# Patient Record
Sex: Male | Born: 1965
Health system: Southern US, Community
[De-identification: ages and names within clinical notes are randomized; demographics above are authoritative.]

## PROBLEM LIST (undated history)

## (undated) DIAGNOSIS — E785 Hyperlipidemia, unspecified: Secondary | ICD-10-CM

## (undated) DIAGNOSIS — E119 Type 2 diabetes mellitus without complications: Secondary | ICD-10-CM

## (undated) DIAGNOSIS — K219 Gastro-esophageal reflux disease without esophagitis: Secondary | ICD-10-CM

## (undated) HISTORY — PX: OTHER SURGICAL HISTORY: SHX169

## (undated) HISTORY — PX: TONSILLECTOMY: SUR1361

## (undated) HISTORY — PX: VASECTOMY: SHX75

## (undated) HISTORY — PX: NASAL SEPTUM SURGERY: SHX37

---

## 2010-05-06 ENCOUNTER — Encounter: Admission: RE | Admit: 2010-05-06 | Discharge: 2010-05-06 | Payer: Self-pay | Admitting: Internal Medicine

## 2011-04-23 ENCOUNTER — Inpatient Hospital Stay (HOSPITAL_COMMUNITY)
Admission: EM | Admit: 2011-04-23 | Discharge: 2011-04-27 | DRG: 270 | Disposition: A | Discharge status: Home / Self Care | Payer: BC Managed Care – PPO | Attending: Internal Medicine | Admitting: Internal Medicine

## 2011-04-23 DIAGNOSIS — A4901 Methicillin susceptible Staphylococcus aureus infection, unspecified site: Secondary | ICD-10-CM | POA: Diagnosis present

## 2011-04-23 DIAGNOSIS — E119 Type 2 diabetes mellitus without complications: Secondary | ICD-10-CM | POA: Diagnosis present

## 2011-04-23 DIAGNOSIS — E785 Hyperlipidemia, unspecified: Secondary | ICD-10-CM | POA: Diagnosis present

## 2011-04-23 DIAGNOSIS — IMO0002 Reserved for concepts with insufficient information to code with codable children: Principal | ICD-10-CM | POA: Diagnosis present

## 2011-04-24 ENCOUNTER — Emergency Department (HOSPITAL_COMMUNITY): Payer: BC Managed Care – PPO

## 2011-04-24 DIAGNOSIS — IMO0002 Reserved for concepts with insufficient information to code with codable children: Secondary | ICD-10-CM

## 2011-04-24 LAB — DIFFERENTIAL
Eosinophils Absolute: 0 10*3/uL (ref 0.0–0.7)
Lymphocytes Relative: 14 % (ref 12–46)
Lymphs Abs: 1.6 10*3/uL (ref 0.7–4.0)
Monocytes Absolute: 0.9 10*3/uL (ref 0.1–1.0)
Monocytes Relative: 8 % (ref 3–12)
Neutro Abs: 9.5 10*3/uL — ABNORMAL HIGH (ref 1.7–7.7)
Neutrophils Relative %: 79 % — ABNORMAL HIGH (ref 43–77)

## 2011-04-24 LAB — CBC
Hemoglobin: 14.8 g/dL (ref 13.0–17.0)
MCH: 30 pg (ref 26.0–34.0)
MCHC: 34.6 g/dL (ref 30.0–36.0)
MCV: 86.8 fL (ref 78.0–100.0)
RBC: 4.93 MIL/uL (ref 4.22–5.81)
RDW: 12.1 % (ref 11.5–15.5)

## 2011-04-24 LAB — BASIC METABOLIC PANEL
BUN: 16 mg/dL (ref 6–23)
GFR calc non Af Amer: >60 mL/min (ref >60)
Sodium: 138 mEq/L (ref 135–145)

## 2011-04-24 LAB — GLUCOSE, CAPILLARY: Glucose-Capillary: 165 mg/dL — ABNORMAL HIGH (ref 70–99)

## 2011-04-25 LAB — DIFFERENTIAL
Basophils Absolute: 0 10*3/uL (ref 0.0–0.1)
Basophils Relative: 0 % (ref 0–1)
Eosinophils Relative: 1 % (ref 0–5)
Lymphocytes Relative: 25 % (ref 12–46)
Monocytes Absolute: 1.1 10*3/uL — ABNORMAL HIGH (ref 0.1–1.0)
Monocytes Relative: 13 % — ABNORMAL HIGH (ref 3–12)
Neutrophils Relative %: 62 % (ref 43–77)

## 2011-04-25 LAB — COMPREHENSIVE METABOLIC PANEL
ALT: 45 U/L (ref 0–53)
AST: 49 U/L — ABNORMAL HIGH (ref 0–37)
Albumin: 3 g/dL — ABNORMAL LOW (ref 3.5–5.2)
BUN: 12 mg/dL (ref 6–23)
Creatinine, Ser: 1.22 mg/dL (ref 0.50–1.35)
Glucose, Bld: 130 mg/dL — ABNORMAL HIGH (ref 70–99)
Total Protein: 6.3 g/dL (ref 6.0–8.3)

## 2011-04-25 LAB — GLUCOSE, CAPILLARY: Glucose-Capillary: 176 mg/dL — ABNORMAL HIGH (ref 70–99)

## 2011-04-25 LAB — CBC
MCH: 30.6 pg (ref 26.0–34.0)
MCV: 87 fL (ref 78.0–100.0)

## 2011-04-25 LAB — VANCOMYCIN, TROUGH: Vancomycin Tr: 9.4 ug/mL — ABNORMAL LOW (ref 10.0–20.0)

## 2011-04-25 LAB — HEMOGLOBIN A1C: Hgb A1c MFr Bld: 7.9 % — ABNORMAL HIGH (ref <5.7)

## 2011-04-26 LAB — BASIC METABOLIC PANEL
BUN: 10 mg/dL (ref 6–23)
Chloride: 102 mEq/L (ref 96–112)
Glucose, Bld: 163 mg/dL — ABNORMAL HIGH (ref 70–99)
Potassium: 3.8 mEq/L (ref 3.5–5.1)
Sodium: 135 mEq/L (ref 135–145)

## 2011-04-26 LAB — CBC
HCT: 36.1 % — ABNORMAL LOW (ref 39.0–52.0)
RDW: 11.9 % (ref 11.5–15.5)
WBC: 7.9 10*3/uL (ref 4.0–10.5)

## 2011-04-26 LAB — GLUCOSE, CAPILLARY
Glucose-Capillary: 158 mg/dL — ABNORMAL HIGH (ref 70–99)
Glucose-Capillary: 201 mg/dL — ABNORMAL HIGH (ref 70–99)

## 2011-04-27 LAB — WOUND CULTURE: Gram Stain: NONE SEEN

## 2011-04-27 LAB — BASIC METABOLIC PANEL
CO2: 26 mEq/L (ref 19–32)
Calcium: 8.6 mg/dL (ref 8.4–10.5)
Creatinine, Ser: 0.96 mg/dL (ref 0.50–1.35)
GFR calc Af Amer: >60 mL/min (ref >60)
GFR calc non Af Amer: >60 mL/min (ref >60)
Sodium: 138 mEq/L (ref 135–145)

## 2011-04-27 LAB — GLUCOSE, CAPILLARY
Glucose-Capillary: 202 mg/dL — ABNORMAL HIGH (ref 70–99)
Glucose-Capillary: 191 mg/dL — ABNORMAL HIGH (ref 70–99)

## 2011-05-01 LAB — CULTURE, BLOOD (ROUTINE X 2)
Culture: NO GROWTH
Culture: NO GROWTH

## 2011-05-02 NOTE — Consult Note (Signed)
Jon Torres, Jon Torres NO.:  [id number]  MEDICAL RECORD NO.:  [id number]  LOCATION:  1315                         FACILITY:  [facility]  PHYSICIAN:  Sandria Bales. Ezzard Standing, M.D.  DATE OF BIRTH:  1966/10/07  DATE OF CONSULTATION: 24 April 2011                                CONSULTATION  REASON FOR CONSULTATION:  Cellulitis, left elbow.  REFERRING PHYSICIAN:  [name], M.D.  HISTORY OF PRESENT ILLNESS:  This is a 45 year old white male who sees Dr. [name] as his primary medical doctor of [facility].  He has  noticed over 4 to 5 days increasing tenderness right at or below his left elbow.  He started running a fever at home, had increasing pain and came to the [facility] Emergency Room.  He is admitted by Dr. [name] for cellulitis at his left elbow and placed on IV antibiotics.  I was called for consultation for management of this infection.  The patient himself has never had any prior infection but he had a son about 3 to 4 years ago who had a MRSA infection.  PAST MEDICAL HISTORY:  He has no allergies.  MEDICINES:  He is on medicine for diabetes but he is not on insulin.  He takes medicine for cholesterol and something for his kidneys.  The names of his medicines I do not have.  REVIEW OF SYSTEMS:   NEUROLOGIC:  No seizure or loss of conscious. PULMONARY:  He does not smoke cigarettes, no pneumonia.  He does not have sleep apnea.   CARDIAC:  He has no heart disease, chest pain or hypertension.  He has not had a  heart catheterization. GASTROINTESTINAL:  No history of peptic ulcer disease or liver disease. UROLOGIC:  No history of kidney stones or kidney infection. MUSCULOSKELETAL:  He had some right shoulder dislocation in the past treated by an orthopedic doctor whose name he cannot remember.  SOCIAL HISTORY:  He is married.  Works in Consulting civil engineer.  PHYSICAL EXAMINATION:  VITAL SIGNS: His temperature is 99.6, pulse is 93, blood pressure  94/63. GENERAL:  He is a well-nourished heavy white male. NECK:  His neck is supple.  I feel no masses or thyromegaly. LUNGS:  Clear to auscultation. HEART:  His heart has a regular rate and rhythm without murmur or rub. EXTREMITIES:  His left elbow, he has a tender area on his left forearm, maybe 3 or 4 cm below the elbow, ulnar side of the forearm.  He can extend his arm almost fully and he can flex it up at least to about 110 degrees.  He really does not have much pain on flexion other than pain locally at the soft tissue area.  He has a raised area which is about 3 or 4 cm consistent with a local cellulitis.  He has palpable pulse in the left arm.  LABORATORY DATA:  His labs show white blood count of 12,000, hemoglobin 14, hematocrit 42.  X-rays of his left elbow show no air or foreign body.  IMPRESSION: 1. My impression is that he has cellulitis of the soft tissues of left     forearm without obvious joint involvement.  [Note: Apparently,  the ER had talked to Dr. Yevette Torres about the patient.  I spoke with Dr. Yevette Torres but it was unclear of how his followup would be.  Anyway, the patient now hospitalized on IV antibiotics and I will do an incision and drainage at the bedside.  1. Diabetes mellitus. 2. History of elevated cholesterol. 3. At least a family member who had an methicillin-resistant     staphylococcus aureus within the last 3 or 4 years. 4. He thinks his tetanus is up-to-date that he has had a tetanus shot     within the last 10 years.   Sandria Bales. Ezzard Standing, M.D., FACS   DHN/MEDQ  D:  04/24/2011  T:  04/24/2011  Job:  [id number]  cc:   Dr. Charlestine Night, M.D. Fax: [phone]  Electronically Signed by Ovidio Kin M.D. on 05/02/2011 11:20:31 AM

## 2011-05-02 NOTE — Op Note (Signed)
  NAMEKATHRYN, LINAREZ NO.:  [id number]  MEDICAL RECORD NO.:  [id number]  LOCATION:  1315                         FACILITY:  [facility]  PHYSICIAN:  Sandria Bales. Ezzard Standing, M.D.  DATE OF BIRTH:  [birth year]  DATE OF PROCEDURE:  04/24/2011                               OPERATIVE REPORT   PREOPERATIVE DIAGNOSIS:  Cellulitis/abscess, left elbow, forearm soft tissue.  POSTOPERATIVE DIAGNOSIS:  Cellulitis, left forearm, elbow soft tissue.  PROCEDURE:  Incision and drainage of abscess.  SURGEON:  Sandria Bales. Ezzard Standing, M.D.  ANESTHESIA:  About 10 cc of 2% Xylocaine.  COMPLICATIONS:  None.  INDICATIONS FOR PROCEDURE:  Mr. Folkert is a 45 year old white male patient of Dr. Juline Patch who came with a tenderness and inflammation of his left forearm right below the elbow on the ulnar side.  He is able to move his elbow, so I do not think he has obvious joint inflammation.  I think he would be best served by having an I and D of this area of the soft tissue.  PROCEDURE:  The patient is in a supine position in his room.  I painted the area with Betadine solution, infiltrated with about 10 cc of 2% Xylocaine.  I made an incision about 2 cm long cutting out about 1 cm disk of skin and got into the soft tissue.  I really did not get much  pus out, maybe a little bit.  I did obtain cultures.  I think this is more of a soft tissue inflammation than an abscess.  We will do dressing changes three times a day.  He is on IV antibiotics. We will elevate the arm and re-evaluate in 24 hours.   Sandria Bales. Ezzard Standing, M.D., FACS   DHN/MEDQ  D:  04/24/2011  T:  04/24/2011  Job:  [id number]  cc:   Juline Patch, M.D. Fax: [phone]  Electronically Signed by Ovidio Kin M.D. on 05/02/2011 11:22:58 AM

## 2011-05-05 NOTE — Discharge Summary (Signed)
  [name], [name]:  [id number]  MEDICAL RECORD NO.:  [id number]  LOCATION:  1315                         FACILITY:  [facility]  PHYSICIAN:  Conley Canal, MD      DATE OF BIRTH:  1965-12-25  DATE OF ADMISSION:  04/23/2011 DATE OF DISCHARGE:  04/27/2011                        DISCHARGE SUMMARY - REFERRING   PRIMARY CARE PHYSICIAN:  Juline Patch, M.D.  CONSULTING PHYSICIAN:  Dr. Ovidio Kin.  DISCHARGE DIAGNOSES: 1. MSSA cellulitis of the left elbow 2. Diabetes mellitus type 2. 3. Hyperlipidemia. 4. Morbid obesity.  DISCHARGE MEDICATIONS: 1. Doxycycline 100 mg twice daily for 6 more days. 2. Moxifloxacin 400 mg daily for 8 more days. 3. Ambien 5 mg q.h.s. p.r.n. 4. Glipizide XL 10 mg daily. 5. Hydrocortisone topical ointment twice daily as needed. 6. Motrin 400 mg every 8 hours as needed. 7. Lipitor 40 mg daily. 8. Lisinopril 10 mg daily. 9. Metformin 500 mg twice daily.  PROCEDURES PERFORMED:  X-ray of the left elbow on April 24, 2011, showed no acute bony abnormalities.  HOSPITAL COURSE:  [name] was admitted on April 23, 2011, with complaints of worsening redness and swelling of the left elbow preceded by a bump in the same area.  At the time of admission, he was found to have cellulitis of the same extremity and his white count was 12,000. He was seen by Dr. Ezzard Standing with [facility] who felt that this was essentially cellulitis and recommended broad-spectrum antibiotics.  The patient has continued to do well and wound cultures sent on April 24, 2011, grew Staphylococcus aureus sensitive to clindamycin, erythromycin, gentamicin, Levaquin, oxacillin, rifampin, Bactrim, vancomycin, tetracycline moxifloxacin.  His white count prior to discharge was 7.9, hemoglobin 12.8, hematocrit 36.1, platelet count 166.  Electrolytes were unremarkable.  Blood cultures were negative x2. Hemoglobin A1c was 7.9.  He is hemodynamically stable and will  be discharged to complete 2 weeks of antibiotics with doxycycline and moxifloxacin.  He will follow with Dr. [name] in the next 1 week.  He may need referral to General Surgery depending on how he is doing.  He is discharged in stable condition.  Time spent for discharge preparation, less than 30 minutes.     Conley Canal, MD     SR/MEDQ  D:  04/27/2011  T:  04/27/2011  Job:  [id number]  cc:   Juline Patch, M.D. Fax: [phone]  Sandria Bales. Ezzard Standing, M.D. [address] [place] [address]  Electronically Signed by Conley Canal  on 05/05/2011 07:29:47 PM

## 2011-05-16 NOTE — H&P (Signed)
Jon Torres, Jon Torres NO.:  [id number]  MEDICAL RECORD NO.:  [id number]  LOCATION:  1315                         FACILITY:  [facility]  PHYSICIAN:  [name], M.D. DATE OF BIRTH:  [birth year]  DATE OF ADMISSION:  04/23/2011 DATE OF DISCHARGE:                             HISTORY & PHYSICAL   DATE OF ADMISSION:  April 24, 2011.  CHIEF COMPLAINT:  Worsening redness and swelling, left elbow.  HISTORY OF PRESENT ILLNESS:  This is a 45 year old male with a history of type 2 diabetes mellitus, who presents to the Emergency Department with complaints of worsening redness and swelling at the left elbow area over the past 5 days.  Patient reports the area started as a small pimple like area, which continued to increase in size and had associated redness and pain.  Patient denies any knowledge of an insect bite.  He does report aggravating the area by trying to squeeze the area.  Patient began to have fever today to a temperature of 101.  In the Emergency Department, patient was evaluated and the EDP did discuss the findings with Orthopedics on-call, Dr. Derald Macleod and recommendations were made for IV antibiotic treatment with vancomycin and Zosyn.  Patient was referred for medical admission.  PAST MEDICAL HISTORY: 1. Type 2 diabetes mellitus. 2. Morbid obesity.  PAST SURGICAL HISTORY:  History of a varicocele repair and nasal septal deviation repair.  MEDICATIONS:  Include: 1. Actos.  Patient reports not taking Actos anymore. 2. Ambien. 3. Glipizide. 4. Lipitor. 5. Lisinopril. 6. Metformin.  ALLERGIES:  No known drug allergies.  SOCIAL HISTORY:  Patient is married with two children.  He is a nonsmoker, nondrinker.  No history of illicit drug usage.  FAMILY HISTORY:  Positive for diabetic disease in his father.  Positive for hypertension in his mother.  Positive for cancer in his paternal grandmother, unknown type.  No coronary artery disease in his  family that he knows of.  REVIEW OF SYSTEMS:  Pertinent as mentioned above.  PHYSICAL EXAMINATION FINDINGS:  GENERAL:  This is a morbidly obese 32- year-old Caucasian male, who is in mild discomfort, but no acute distress. VITAL SIGNS:  Currently, temperature 100.9, blood pressure 122/86, heart rate 114, respirations 20, O2 saturations 98%. HEENT EXAMINATION:  Normocephalic, atraumatic.  Pupils are equally round and reactive to light.  Extraocular movements are intact.  Funduscopic benign.  There is no scleral icterus.  Nares are patent bilaterally. Oropharynx is clear. NECK:  Supple.  Full range of motion.  No thyromegaly, adenopathy, jugular venous distention. CARDIOVASCULAR:  Regular rate and rhythm with mild tachycardia.  No murmurs, gallops or rubs appreciated. CHEST:  Chest wall:  Normal excursion.  No chest wall tenderness and breathing is unlabored. ABDOMEN:  Positive bowel sounds.  Soft, nontender, nondistended.  No hepatosplenomegaly. EXTREMITIES:  Without cyanosis, clubbing or edema. SKIN EXAMINATION:  Patient has inflammation at the point of the olecranon bursa with left elbow.  There is an area of induration and inflammation with a circumference of approximately 1 cm.  There is a central punctum or scar area.  There is no exudation from the area.  The area is tender and fluctuant. NEUROLOGIC EXAMINATION:  Nonfocal.  LABORATORY STUDIES:  White blood cell count 12.0, hemoglobin 14.8, hematocrit 42.8, platelets 173,000, neutrophils 79%, lymphocytes 14%. Sodium 138, potassium 4.2, chloride 101, CO2 of 28, BUN 16, creatinine 1.10 and glucose 140.  DIAGNOSTIC STUDIES:  X-rays of the left elbow are negative for fracture and negative for any gas collections, which would imply osteomyelitis.  ASSESSMENT:  [age]-year-old male being admitted with: 1. Cellulitis of the left elbow. 2. Type 2 diabetes mellitus. 3. Morbid obesity.  PLAN:  Patient will be admitted to a  med-surg area.  IV antibiotic therapy of vancomycin and Zosyn will be continued.  IV fluids have also been ordered and patient will be placed on sliding scale insulin coverage.  His medications will be further verified.  DVT prophylaxis has been ordered.  General surgery has been consulted to see this patient to evaluate for possible I and D if it is possible.  Code status is a full code.     [name], M.D.     HJ/MEDQ  D:  04/24/2011  T:  04/24/2011  Job:  [id number]  Electronically Signed by [name] M.D. on 05/16/2011 11:54:19 AM

## 2011-11-18 IMAGING — CR DG ELBOW COMPLETE 3+V*L*
4 series · 4 of 4 positions shown · non-contrast
Comparison: None.

CLINICAL DATA: Elbow pain with possible insect bite.  Fever.

LEFT ELBOW - COMPLETE 3+ VIEW

[x elbow joint ap left]
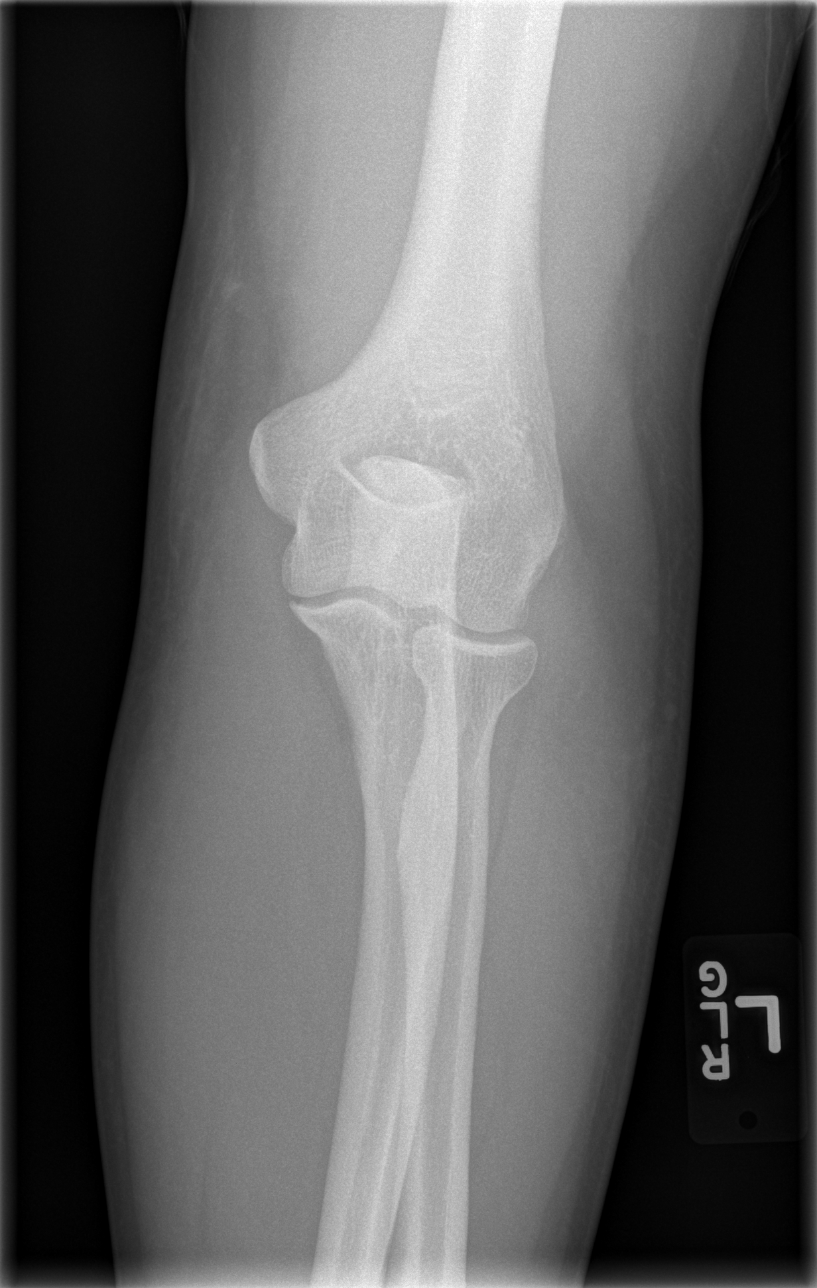

[x elbow joint obl. left *]
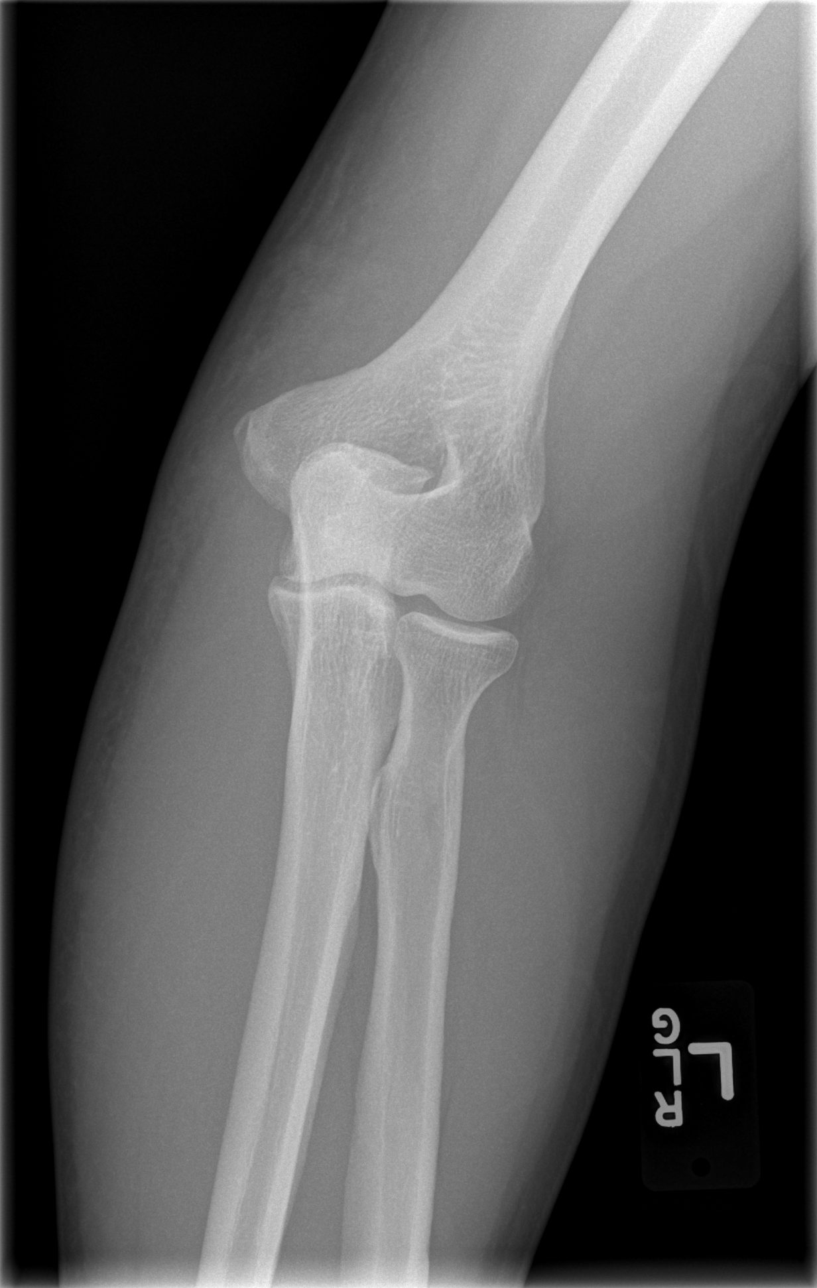

[x elbow joint obl. left]
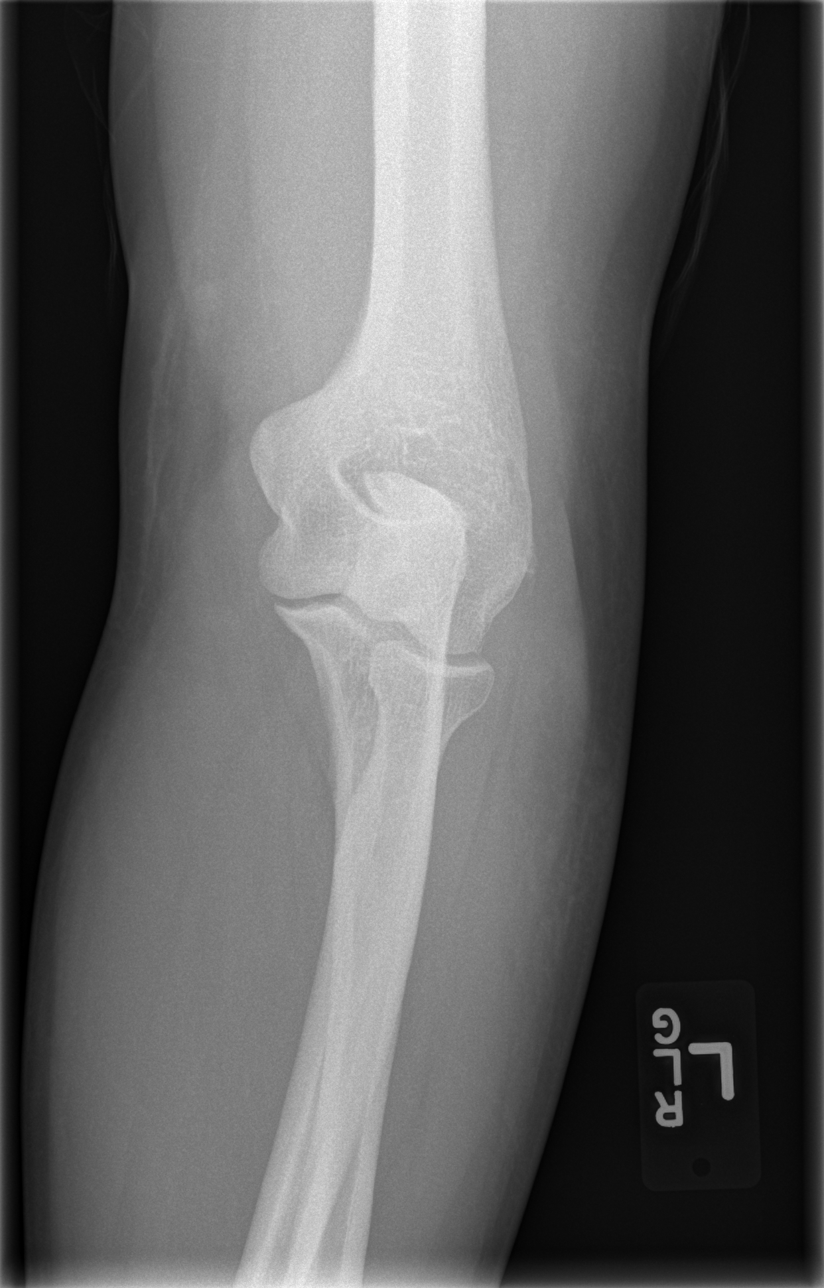

[x elbow joint lat left]
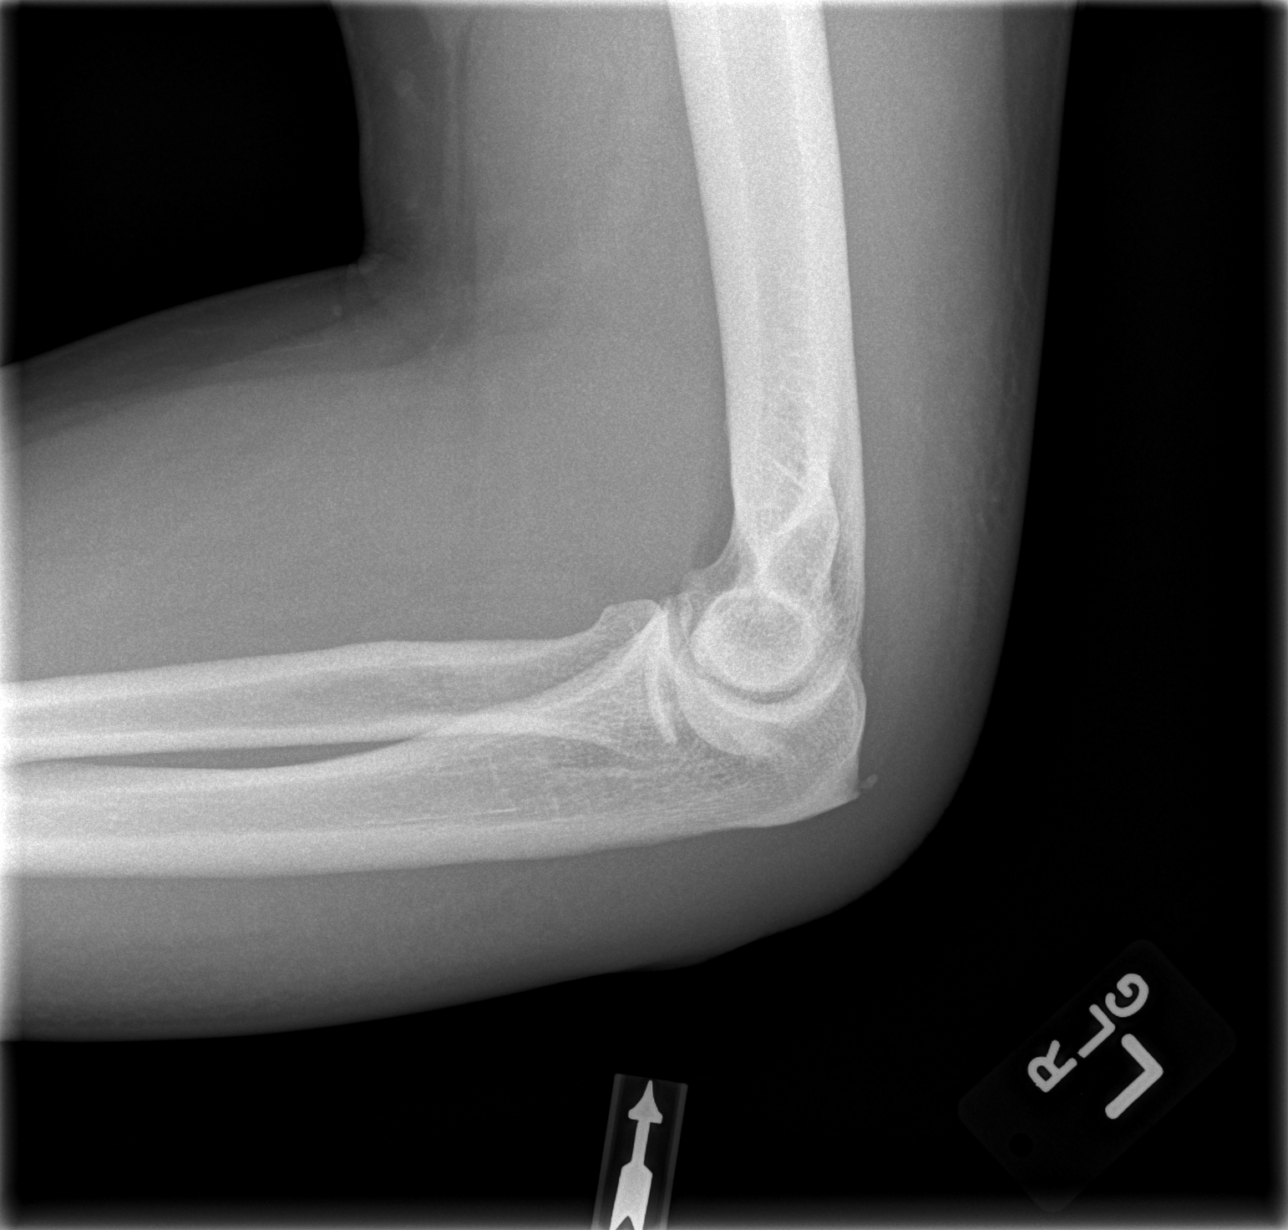

[4 of 4 positions shown; findings below may reference images not displayed]

FINDINGS: Small olecranon spur. No evidence of acute fracture or
subluxation.  No focal bone lesions.  Bone matrix and cortex appear
intact.  No abnormal radiopaque densities in the soft tissues. No
abnormal gas collections in the visualized soft tissues.  No
significant effusion.
IMPRESSION: No acute bony abnormalities demonstrated no radiopaque foreign
bodies or gas collections demonstrated in the soft tissues.

## 2015-12-16 MED FILL — ZOLPIDEM TARTRATE 10 MG TAB: 10 | 30 days supply | Qty: 30 | Fill #0

## 2016-01-27 MED FILL — ZOLPIDEM TARTRATE 10 MG TAB: 10 | 30 days supply | Qty: 30 | Fill #1

## 2016-02-26 MED FILL — ZOLPIDEM TARTRATE 10 MG TAB: 10 | 30 days supply | Qty: 30 | Fill #2

## 2016-04-06 MED FILL — ZOLPIDEM TARTRATE 10 MG TAB: 10 | 30 days supply | Qty: 30 | Fill #3

## 2016-05-09 MED FILL — ZOLPIDEM TARTRATE 10 MG TAB: 10 | 30 days supply | Qty: 30 | Fill #4

## 2016-06-29 MED FILL — ZOLPIDEM TARTRATE 10 MG TAB: 10 | 30 days supply | Qty: 30 | Fill #0

## 2016-07-29 MED FILL — ZOLPIDEM TARTRATE 10 MG TAB: 10 | 30 days supply | Qty: 30 | Fill #1

## 2016-09-08 MED FILL — ZOLPIDEM TARTRATE 10 MG TAB: 10 | 30 days supply | Qty: 30 | Fill #2

## 2016-10-07 MED FILL — ZOLPIDEM TARTRATE 10 MG TAB: 10 | 30 days supply | Qty: 30 | Fill #3

## 2016-11-09 MED FILL — ZOLPIDEM TARTRATE 10 MG TAB: 10 | 30 days supply | Qty: 30 | Fill #4

## 2016-12-08 MED FILL — ZOLPIDEM TARTRATE 10 MG TAB: 10 | 30 days supply | Qty: 30 | Fill #5

## 2017-01-10 MED FILL — ZOLPIDEM TARTRATE 10 MG TAB: 10 | 30 days supply | Qty: 30 | Fill #0

## 2017-02-08 MED FILL — ZOLPIDEM TARTRATE 10 MG TAB: 10 | 30 days supply | Qty: 30 | Fill #1

## 2017-03-08 MED FILL — ZOLPIDEM TARTRATE 10 MG TAB: 10 | 30 days supply | Qty: 30 | Fill #2

## 2017-04-10 MED FILL — ZOLPIDEM TARTRATE 10 MG TAB: 10 | 30 days supply | Qty: 30 | Fill #3

## 2017-05-09 MED FILL — ZOLPIDEM TARTRATE 10 MG TAB: 10 | 30 days supply | Qty: 30 | Fill #4

## 2017-06-12 MED FILL — ZOLPIDEM TARTRATE 10 MG TAB: 10 | 30 days supply | Qty: 30 | Fill #5

## 2017-07-12 MED FILL — ZOLPIDEM TARTRATE 10 MG TAB: 10 | 30 days supply | Qty: 30 | Fill #0

## 2017-08-29 MED FILL — ZOLPIDEM TARTRATE 10 MG TAB: 10 | 30 days supply | Qty: 30 | Fill #0

## 2017-10-03 MED FILL — ZOLPIDEM TARTRATE 10 MG TAB: 10 | 30 days supply | Qty: 30 | Fill #1

## 2017-11-20 MED FILL — ZOLPIDEM TARTRATE 10 MG TAB: 10 | 30 days supply | Qty: 30 | Fill #2

## 2017-12-28 MED FILL — ZOLPIDEM TARTRATE 10 MG TAB: 10 | 30 days supply | Qty: 30 | Fill #3

## 2018-01-26 MED FILL — ZOLPIDEM TARTRATE 10 MG TAB: 10 | 30 days supply | Qty: 30 | Fill #4

## 2018-04-03 MED FILL — ZOLPIDEM TARTRATE 10 MG TAB: 10 | 30 days supply | Qty: 30 | Fill #0

## 2018-06-19 MED FILL — ZOLPIDEM TARTRATE 10 MG TAB: 10 | 30 days supply | Qty: 30 | Fill #1

## 2018-08-10 MED FILL — ZOLPIDEM TARTRATE 10 MG TAB: 10 | 30 days supply | Qty: 30 | Fill #2

## 2018-09-21 MED FILL — ZOLPIDEM TARTRATE 10 MG TAB: 10 | 30 days supply | Qty: 30 | Fill #3

## 2018-10-29 MED FILL — ZOLPIDEM TARTRATE 10 MG TAB: 10 | 30 days supply | Qty: 30 | Fill #0

## 2018-12-11 MED FILL — ZOLPIDEM TARTRATE 10 MG TAB: 10 | 30 days supply | Qty: 30 | Fill #1 | Status: TO

## 2019-02-01 MED FILL — ZOLPIDEM TARTRATE 10 MG TAB: 10 | 30 days supply | Qty: 30 | Fill #0

## 2019-03-13 MED FILL — ZOLPIDEM TARTRATE 10 MG TAB: 10 | 30 days supply | Qty: 30 | Fill #1

## 2019-04-29 MED FILL — ZOLPIDEM TARTRATE 10 MG TAB: 10 | 30 days supply | Qty: 30 | Fill #0

## 2019-06-14 MED FILL — ZOLPIDEM TARTRATE 10 MG TAB: 10 | 30 days supply | Qty: 30 | Fill #1

## 2019-07-17 MED FILL — ZOLPIDEM TARTRATE 10 MG TAB: 10 | 30 days supply | Qty: 30 | Fill #2

## 2019-07-19 ENCOUNTER — Ambulatory Visit: Payer: Self-pay | Admitting: Podiatry

## 2019-08-30 MED FILL — ZOLPIDEM TARTRATE 10 MG TAB: 10 | 30 days supply | Qty: 30 | Fill #3

## 2020-01-08 ENCOUNTER — Ambulatory Visit: Payer: Self-pay | Attending: Internal Medicine

## 2020-01-08 DIAGNOSIS — Z23 Encounter for immunization: Secondary | ICD-10-CM

## 2020-01-08 NOTE — Progress Notes (Signed)
   Covid-19 Vaccination Clinic  Name:  Jon Torres    MRN: [id number] DOB: 12-08-1965  01/08/2020  Jon Torres was observed post Covid-19 immunization for 15 minutes without incident. He was provided with Vaccine Information Sheet and instruction to access the V-Safe system.   Jon Torres was instructed to call 911 with any severe reactions post vaccine: Marland Kitchen Difficulty breathing  . Swelling of face and throat  . A fast heartbeat  . A bad rash all over body  . Dizziness and weakness   Immunizations Administered    Name Date Dose VIS Date Route   Pfizer COVID-19 Vaccine 01/08/2020  4:29 PM 0.3 mL 10/04/2019 Intramuscular   Manufacturer: [employer], [employer]   Lot: [id number]   NDC: [id number]

## 2020-02-03 ENCOUNTER — Ambulatory Visit: Payer: Self-pay | Attending: Internal Medicine

## 2020-02-03 DIAGNOSIS — Z23 Encounter for immunization: Secondary | ICD-10-CM

## 2020-02-03 NOTE — Progress Notes (Signed)
   Covid-19 Vaccination Clinic  Name:  Jon Torres    MRN: [id number] DOB: Apr 29, 1966  02/03/2020  [name] was observed post Covid-19 immunization for 15 minutes without incident. He was provided with Vaccine Information Sheet and instruction to access the V-Safe system.   [name] was instructed to call 911 with any severe reactions post vaccine: Marland Kitchen Difficulty breathing  . Swelling of face and throat  . A fast heartbeat  . A bad rash all over body  . Dizziness and weakness   Immunizations Administered    Name Date Dose VIS Date Route   Pfizer COVID-19 Vaccine 02/03/2020  4:22 PM 0.3 mL 10/04/2019 Intramuscular   Manufacturer: [employer], [employer]   Lot: [id number]   NDC: [address]-2

## 2020-09-09 ENCOUNTER — Other Ambulatory Visit: Payer: Self-pay | Admitting: Otolaryngology

## 2020-09-15 ENCOUNTER — Encounter (HOSPITAL_BASED_OUTPATIENT_CLINIC_OR_DEPARTMENT_OTHER): Payer: Self-pay | Admitting: Otolaryngology

## 2020-09-15 ENCOUNTER — Other Ambulatory Visit: Payer: Self-pay

## 2020-09-21 ENCOUNTER — Other Ambulatory Visit (HOSPITAL_COMMUNITY)
Admission: RE | Admit: 2020-09-21 | Discharge: 2020-09-21 | Disposition: A | Discharge status: Home / Self Care | Payer: BC Managed Care – PPO | Source: Ambulatory Visit | Attending: Otolaryngology | Admitting: Otolaryngology

## 2020-09-21 ENCOUNTER — Other Ambulatory Visit: Payer: Self-pay

## 2020-09-21 ENCOUNTER — Encounter (HOSPITAL_BASED_OUTPATIENT_CLINIC_OR_DEPARTMENT_OTHER)
Admission: RE | Admit: 2020-09-21 | Discharge: 2020-09-21 | Disposition: A | Discharge status: Home / Self Care | Payer: BC Managed Care – PPO | Source: Ambulatory Visit | Attending: Otolaryngology | Admitting: Otolaryngology

## 2020-09-21 DIAGNOSIS — Z01818 Encounter for other preprocedural examination: Secondary | ICD-10-CM | POA: Insufficient documentation

## 2020-09-21 DIAGNOSIS — J3489 Other specified disorders of nose and nasal sinuses: Secondary | ICD-10-CM | POA: Diagnosis not present

## 2020-09-21 DIAGNOSIS — E139 Other specified diabetes mellitus without complications: Secondary | ICD-10-CM | POA: Insufficient documentation

## 2020-09-21 DIAGNOSIS — Z01812 Encounter for preprocedural laboratory examination: Secondary | ICD-10-CM | POA: Insufficient documentation

## 2020-09-21 DIAGNOSIS — Z79899 Other long term (current) drug therapy: Secondary | ICD-10-CM | POA: Diagnosis not present

## 2020-09-21 DIAGNOSIS — Z20822 Contact with and (suspected) exposure to covid-19: Secondary | ICD-10-CM | POA: Insufficient documentation

## 2020-09-21 DIAGNOSIS — Z7984 Long term (current) use of oral hypoglycemic drugs: Secondary | ICD-10-CM | POA: Diagnosis not present

## 2020-09-21 DIAGNOSIS — Z794 Long term (current) use of insulin: Secondary | ICD-10-CM | POA: Diagnosis not present

## 2020-09-21 DIAGNOSIS — J343 Hypertrophy of nasal turbinates: Secondary | ICD-10-CM | POA: Diagnosis not present

## 2020-09-21 LAB — BASIC METABOLIC PANEL
Anion gap: 13 (ref 5–15)
BUN: 14 mg/dL (ref 6–20)
CO2: 23 mmol/L (ref 22–32)
Calcium: 9.1 mg/dL (ref 8.9–10.3)
Chloride: 102 mmol/L (ref 98–111)
Creatinine, Ser: 1.17 mg/dL (ref 0.61–1.24)
GFR, Estimated: >60 mL/min (ref >60)
Glucose, Bld: 259 mg/dL — ABNORMAL HIGH (ref 70–99)
Potassium: 5 mmol/L (ref 3.5–5.1)
Sodium: 138 mmol/L (ref 135–145)

## 2020-09-21 LAB — SARS CORONAVIRUS 2 (TAT 6-24 HRS): SARS Coronavirus 2: NEGATIVE

## 2020-09-21 NOTE — Progress Notes (Signed)
Blood glucose-259, Dr. Hyacinth Meeker aware, will recheck day of surgery.

## 2020-09-24 ENCOUNTER — Encounter (HOSPITAL_BASED_OUTPATIENT_CLINIC_OR_DEPARTMENT_OTHER)
Admission: RE | Disposition: A | Discharge status: Home / Self Care | Payer: Self-pay | Source: Home / Self Care | Attending: Otolaryngology

## 2020-09-24 ENCOUNTER — Ambulatory Visit (HOSPITAL_BASED_OUTPATIENT_CLINIC_OR_DEPARTMENT_OTHER): Payer: BC Managed Care – PPO | Admitting: Anesthesiology

## 2020-09-24 ENCOUNTER — Other Ambulatory Visit: Payer: Self-pay

## 2020-09-24 ENCOUNTER — Encounter (HOSPITAL_BASED_OUTPATIENT_CLINIC_OR_DEPARTMENT_OTHER): Payer: Self-pay | Admitting: Otolaryngology

## 2020-09-24 ENCOUNTER — Ambulatory Visit (HOSPITAL_BASED_OUTPATIENT_CLINIC_OR_DEPARTMENT_OTHER)
Admission: RE | Admit: 2020-09-24 | Discharge: 2020-09-24 | Disposition: A | Discharge status: Home / Self Care | Payer: BC Managed Care – PPO | Attending: Otolaryngology | Admitting: Otolaryngology

## 2020-09-24 DIAGNOSIS — J3489 Other specified disorders of nose and nasal sinuses: Secondary | ICD-10-CM | POA: Insufficient documentation

## 2020-09-24 DIAGNOSIS — J343 Hypertrophy of nasal turbinates: Secondary | ICD-10-CM | POA: Insufficient documentation

## 2020-09-24 DIAGNOSIS — Z794 Long term (current) use of insulin: Secondary | ICD-10-CM | POA: Insufficient documentation

## 2020-09-24 DIAGNOSIS — Z20822 Contact with and (suspected) exposure to covid-19: Secondary | ICD-10-CM | POA: Insufficient documentation

## 2020-09-24 DIAGNOSIS — Z79899 Other long term (current) drug therapy: Secondary | ICD-10-CM | POA: Insufficient documentation

## 2020-09-24 DIAGNOSIS — Z7984 Long term (current) use of oral hypoglycemic drugs: Secondary | ICD-10-CM | POA: Insufficient documentation

## 2020-09-24 HISTORY — DX: Gastro-esophageal reflux disease without esophagitis: K21.9

## 2020-09-24 HISTORY — PX: NASAL TURBINATE REDUCTION: SHX2072

## 2020-09-24 HISTORY — DX: Type 2 diabetes mellitus without complications: E11.9

## 2020-09-24 LAB — GLUCOSE, CAPILLARY
Glucose-Capillary: 243 mg/dL — ABNORMAL HIGH (ref 70–99)
Glucose-Capillary: 240 mg/dL — ABNORMAL HIGH (ref 70–99)

## 2020-09-24 SURGERY — EXCISION, NASAL TURBINATE, SUBMUCOSAL
Anesthesia: General | Site: Nose | Laterality: Bilateral

## 2020-09-24 MED ORDER — LIDOCAINE-EPINEPHRINE 1 %-1:100000 IJ SOLN
INTRAMUSCULAR | Status: DC | PRN
  Administered 2020-09-24: 5 mL

## 2020-09-24 MED ORDER — CHLORHEXIDINE GLUCONATE CLOTH 2 % EX PADS: 6.0000 | MEDICATED_PAD | Freq: Once | CUTANEOUS | Status: DC

## 2020-09-24 MED ORDER — LIDOCAINE HCL (CARDIAC) PF 100 MG/5ML IV SOSY
PREFILLED_SYRINGE | INTRAVENOUS | Status: DC | PRN
  Administered 2020-09-24: 80 mg via INTRAVENOUS

## 2020-09-24 MED ORDER — FENTANYL CITRATE (PF) 100 MCG/2ML IJ SOLN
INTRAMUSCULAR | Status: AC
  Filled 2020-09-24: qty 2

## 2020-09-24 MED ORDER — OXYMETAZOLINE HCL 0.05 % NA SOLN
NASAL | Status: DC | PRN
  Administered 2020-09-24: 1 via TOPICAL

## 2020-09-24 MED ORDER — DEXAMETHASONE SODIUM PHOSPHATE 10 MG/ML IJ SOLN
INTRAMUSCULAR | Status: AC
  Filled 2020-09-24: qty 1

## 2020-09-24 MED ORDER — LACTATED RINGERS IV SOLN: INTRAVENOUS | Status: DC

## 2020-09-24 MED ORDER — PHENYLEPHRINE HCL (PRESSORS) 10 MG/ML IV SOLN
INTRAVENOUS | Status: DC | PRN
  Administered 2020-09-24 (×3): 120 ug via INTRAVENOUS

## 2020-09-24 MED ORDER — SUCCINYLCHOLINE CHLORIDE 20 MG/ML IJ SOLN
INTRAMUSCULAR | Status: DC | PRN
  Administered 2020-09-24: 140 mg via INTRAVENOUS

## 2020-09-24 MED ORDER — LIDOCAINE 2% (20 MG/ML) 5 ML SYRINGE
INTRAMUSCULAR | Status: AC
  Filled 2020-09-24: qty 5

## 2020-09-24 MED ORDER — MIDAZOLAM HCL 2 MG/2ML IJ SOLN
INTRAMUSCULAR | Status: AC
  Filled 2020-09-24: qty 2

## 2020-09-24 MED ORDER — DEXAMETHASONE SODIUM PHOSPHATE 4 MG/ML IJ SOLN
INTRAMUSCULAR | Status: DC | PRN
  Administered 2020-09-24: 10 mg via INTRAVENOUS

## 2020-09-24 MED ORDER — ONDANSETRON HCL 4 MG/2ML IJ SOLN
INTRAMUSCULAR | Status: DC | PRN
  Administered 2020-09-24: 4 mg via INTRAVENOUS

## 2020-09-24 MED ORDER — CEFAZOLIN SODIUM-DEXTROSE 2-3 GM-%(50ML) IV SOLR
INTRAVENOUS | Status: DC | PRN
  Administered 2020-09-24: 2 g via INTRAVENOUS

## 2020-09-24 MED ORDER — FENTANYL CITRATE (PF) 100 MCG/2ML IJ SOLN: 25.0000 ug | INTRAMUSCULAR | Status: DC | PRN

## 2020-09-24 MED ORDER — ONDANSETRON HCL 4 MG/2ML IJ SOLN: 4.0000 mg | Freq: Once | INTRAMUSCULAR | Status: DC | PRN

## 2020-09-24 MED ORDER — ONDANSETRON HCL 4 MG/2ML IJ SOLN
INTRAMUSCULAR | Status: AC
  Filled 2020-09-24: qty 2

## 2020-09-24 MED ORDER — ROCURONIUM BROMIDE 10 MG/ML (PF) SYRINGE
PREFILLED_SYRINGE | INTRAVENOUS | Status: AC
  Filled 2020-09-24: qty 10

## 2020-09-24 MED ORDER — PROPOFOL 10 MG/ML IV BOLUS
INTRAVENOUS | Status: AC
  Filled 2020-09-24: qty 20

## 2020-09-24 MED ORDER — SUGAMMADEX SODIUM 500 MG/5ML IV SOLN
INTRAVENOUS | Status: AC
  Filled 2020-09-24: qty 5

## 2020-09-24 MED ORDER — OXYCODONE HCL 5 MG PO TABS: 5.0000 mg | ORAL_TABLET | Freq: Once | ORAL | Status: DC | PRN

## 2020-09-24 MED ORDER — OXYCODONE HCL 5 MG/5ML PO SOLN: 5.0000 mg | Freq: Once | ORAL | Status: DC | PRN

## 2020-09-24 MED ORDER — PROPOFOL 10 MG/ML IV BOLUS
INTRAVENOUS | Status: DC | PRN
  Administered 2020-09-24: 150 mg via INTRAVENOUS

## 2020-09-24 SURGICAL SUPPLY — 28 items
BLADE INF TURB ROT M4 2 5PK (BLADE) IMPLANT
BLADE INF TURB ROT M4 2MM 5PK (BLADE)
BLADE TRICUT ROTATE M4 4 5PK (BLADE) IMPLANT
BLADE TRICUT ROTATE M4 4MM 5PK (BLADE)
CANISTER SUCT 1200ML W/VALVE (MISCELLANEOUS) ×3 IMPLANT
COAGULATOR SUCT 8FR VV (MISCELLANEOUS) IMPLANT
COVER WAND RF STERILE (DRAPES) IMPLANT
DECANTER SPIKE VIAL GLASS SM (MISCELLANEOUS) IMPLANT
ELECT REM PT RETURN 9FT ADLT (ELECTROSURGICAL)
ELECTRODE REM PT RTRN 9FT ADLT (ELECTROSURGICAL) IMPLANT
GLOVE BIO SURGEON STRL SZ7.5 (GLOVE) ×3 IMPLANT
GOWN STRL REUS W/ TWL LRG LVL3 (GOWN DISPOSABLE) ×1 IMPLANT
GOWN STRL REUS W/TWL LRG LVL3 (GOWN DISPOSABLE) ×3
HEMOSTAT SURGICEL .5X2 ABSORB (HEMOSTASIS) IMPLANT
IV NS 500ML (IV SOLUTION) ×3
IV NS 500ML BAXH (IV SOLUTION) ×1 IMPLANT
NEEDLE PRECISIONGLIDE 27X1.5 (NEEDLE) ×3 IMPLANT
NS IRRIG 1000ML POUR BTL (IV SOLUTION) IMPLANT
PACK BASIN DAY SURGERY FS (CUSTOM PROCEDURE TRAY) ×3 IMPLANT
PACK ENT DAY SURGERY (CUSTOM PROCEDURE TRAY) ×3 IMPLANT
PATTIES SURGICAL .5 X3 (DISPOSABLE) ×3 IMPLANT
SPONGE GAUZE 2X2 8PLY STER LF (GAUZE/BANDAGES/DRESSINGS) ×1
SPONGE GAUZE 2X2 8PLY STRL LF (GAUZE/BANDAGES/DRESSINGS) ×2 IMPLANT
SUT ETHILON 3 0 PS 1 (SUTURE) IMPLANT
TOWEL GREEN STERILE FF (TOWEL DISPOSABLE) ×3 IMPLANT
TUBE CONNECTING 20'X1/4 (TUBING) ×1
TUBE CONNECTING 20X1/4 (TUBING) ×2 IMPLANT
YANKAUER SUCT BULB TIP NO VENT (SUCTIONS) ×3 IMPLANT

## 2020-09-24 NOTE — Anesthesia Procedure Notes (Signed)
Procedure Name: Intubation Performed by: Verita Lamb, CRNA Pre-anesthesia Checklist: Patient identified, Emergency Drugs available, Suction available and Patient being monitored Patient Re-evaluated:Patient Re-evaluated prior to induction Oxygen Delivery Method: Circle system utilized Preoxygenation: Pre-oxygenation with 100% oxygen Induction Type: IV induction Ventilation: Mask ventilation without difficulty Laryngoscope Size: Mac and 4 Grade View: Grade II Tube type: Oral Tube size: 8.0 mm Number of attempts: 2 Airway Equipment and Method: Stylet and Oral airway Placement Confirmation: ETT inserted through vocal cords under direct vision,  positive ETCO2,  breath sounds checked- equal and bilateral and CO2 detector Secured at: 23 cm Tube secured with: Tape Dental Injury: Teeth and Oropharynx as per pre-operative assessment  Comments: Pt's oral opening is very small, oral airway required for mask ventilation.  Mac 3 blade used first with only view of epiglottis.  Mac 4 with firm cricoid used for second attempt and able to view the base of the glottic opening.  Oral rae inserted easily and atraumatically, bbs

## 2020-09-24 NOTE — Anesthesia Preprocedure Evaluation (Signed)
Anesthesia Evaluation  Patient identified by MRN, date of birth, ID band Patient awake    Reviewed: Allergy & Precautions, NPO status , Patient's Chart, lab work & pertinent test results  Airway Mallampati: III  TM Distance: >3 FB Neck ROM: Full  Mouth opening: Limited Mouth Opening  Dental no notable dental hx.    Pulmonary neg pulmonary ROS,    Pulmonary exam normal breath sounds clear to auscultation       Cardiovascular Exercise Tolerance: Good negative cardio ROS Normal cardiovascular exam Rhythm:Regular Rate:Normal     Neuro/Psych negative neurological ROS  negative psych ROS   GI/Hepatic GERD  ,  Endo/Other  diabetes, Well Controlled, Type 2, Oral Hypoglycemic Agents, Insulin Dependentobesity  Renal/GU   negative genitourinary   Musculoskeletal negative musculoskeletal ROS (+)   Abdominal   Peds negative pediatric ROS (+)  Hematology negative hematology ROS (+)   Anesthesia Other Findings   Reproductive/Obstetrics negative OB ROS                             Anesthesia Physical Anesthesia Plan  ASA: III  Anesthesia Plan: General   Post-op Pain Management:    Induction:   PONV Risk Score and Plan: 2 and Dexamethasone, Ondansetron and Treatment may vary due to age or medical condition  Airway Management Planned: Oral ETT  Additional Equipment: None  Intra-op Plan:   Post-operative Plan: Extubation in OR  Informed Consent: I have reviewed the patients History and Physical, chart, labs and discussed the procedure including the risks, benefits and alternatives for the proposed anesthesia with the patient or authorized representative who has indicated his/her understanding and acceptance.     Dental advisory given  Plan Discussed with: CRNA and Anesthesiologist  Anesthesia Plan Comments:         Anesthesia Quick Evaluation

## 2020-09-24 NOTE — H&P (Signed)
Jon Torres is an 54 y.o. male.   Chief Complaint: Turbinate hypertrophy HPI: 54 year old male with nasal obstruction due to turbinate hypertrophy that has not responded to medical management.  He presents for surgical management.  Past Medical History:  Diagnosis Date  . Diabetes mellitus without complication (HCC)   . GERD (gastroesophageal reflux disease)     Past Surgical History:  Procedure Laterality Date  . NASAL SEPTUM SURGERY    . TONSILLECTOMY    . VASECTOMY    . vericocele     as a child    History reviewed. No pertinent family history. Social History:  reports that he has never smoked. He has never used smokeless tobacco. He reports current alcohol use. He reports that he does not use drugs.  Allergies:  Allergies  Allergen Reactions  . Vytorin [Ezetimibe-Simvastatin] Rash    Medications Prior to Admission  Medication Sig Dispense Refill  . diclofenac (VOLTAREN) 75 MG EC tablet Take 75 mg by mouth once.    . Dulaglutide (TRULICITY) 1.5 MG/0.5ML SOPN Inject into the skin once a week.    . empagliflozin (JARDIANCE) 10 MG TABS tablet Take by mouth daily.    . Insulin Degludec (TRESIBA) 100 UNIT/ML SOLN Inject 120 Units into the skin 2 (two) times daily after a meal.    . metFORMIN (GLUCOPHAGE) 500 MG tablet Take 500 mg by mouth 2 (two) times daily with a meal.    . pantoprazole (PROTONIX) 40 MG tablet Take 40 mg by mouth daily.    . rosuvastatin (CRESTOR) 20 MG tablet Take 20 mg by mouth daily.    . tamsulosin (FLOMAX) 0.4 MG CAPS capsule Take 0.4 mg by mouth daily.    . traZODone (DESYREL) 50 MG tablet Take 50 mg by mouth at bedtime.    . tadalafil (CIALIS) 5 MG tablet Take 5 mg by mouth daily as needed for erectile dysfunction.      Results for orders placed or performed during the hospital encounter of 09/24/20 (from the past 48 hour(s))  Glucose, capillary     Status: Abnormal   Collection Time: 09/24/20  8:05 AM  Result Value Ref Range    Glucose-Capillary 243 (H) 70 - 99 mg/dL    Comment: Glucose reference range applies only to samples taken after fasting for at least 8 hours.   No results found.  Review of Systems  All other systems reviewed and are negative.   Blood pressure 114/79, pulse 83, temperature 97.7 F (36.5 C), temperature source Oral, resp. rate 18, height 6' (1.829 m), weight 105.4 kg, SpO2 97 %. Physical Exam Constitutional:      Appearance: Normal appearance. He is normal weight.  HENT:     Head: Normocephalic and atraumatic.     Right Ear: External ear normal.     Left Ear: External ear normal.     Nose: Nose normal.     Mouth/Throat:     Mouth: Mucous membranes are moist.     Pharynx: Oropharynx is clear.  Eyes:     Extraocular Movements: Extraocular movements intact.     Conjunctiva/sclera: Conjunctivae normal.     Pupils: Pupils are equal, round, and reactive to light.  Cardiovascular:     Rate and Rhythm: Normal rate.  Pulmonary:     Effort: Pulmonary effort is normal.  Skin:    General: Skin is warm and dry.  Neurological:     General: No focal deficit present.     Mental Status: He  is alert and oriented to person, place, and time.  Psychiatric:        Mood and Affect: Mood normal.        Behavior: Behavior normal.        Thought Content: Thought content normal.        Judgment: Judgment normal.      Assessment/Plan Turbinate hypertrophy  To OR for turbinate reduction.  Christia Reading, MD 09/24/2020, 8:33 AM

## 2020-09-24 NOTE — Discharge Instructions (Signed)

## 2020-09-24 NOTE — Brief Op Note (Signed)
09/24/2020  9:25 AM  PATIENT:  Jon Torres  54 y.o. male  PRE-OPERATIVE DIAGNOSIS:  Nasal turbinate hypertrophy  POST-OPERATIVE DIAGNOSIS:  Nasal turbinate hypertrophy  PROCEDURE:  Procedure(s): TURBINATE REDUCTION/SUBMUCOSAL RESECTION (Bilateral)  SURGEON:  Surgeon(s) and Role:    Christia Reading, MD - Primary  PHYSICIAN ASSISTANT:   ASSISTANTS: none   ANESTHESIA:   general  EBL:  Minimal  BLOOD ADMINISTERED:none  DRAINS: none   LOCAL MEDICATIONS USED:  LIDOCAINE   SPECIMEN:  No Specimen  DISPOSITION OF SPECIMEN:  N/A  COUNTS:  YES  TOURNIQUET:  * No tourniquets in log *  DICTATION: .Note written in EPIC  PLAN OF CARE: Discharge to home after PACU  PATIENT DISPOSITION:  PACU - hemodynamically stable.   Delay start of Pharmacological VTE agent (>24hrs) due to surgical blood loss or risk of bleeding: yes

## 2020-09-24 NOTE — Transfer of Care (Signed)
Immediate Anesthesia Transfer of Care Note  Patient: Jon Torres  Procedure(s) Performed: [name] REDUCTION/SUBMUCOSAL RESECTION (Bilateral Nose)  Patient Location: PACU  Anesthesia Type:General  Level of Consciousness: awake, alert  and oriented  Airway & Oxygen Therapy: Patient Spontanous Breathing and Patient connected to face mask oxygen  Post-op Assessment: Report given to RN and Post -op Vital signs reviewed and stable  Post vital signs: Reviewed and stable  Last Vitals:  Vitals Value Taken Time  BP 118/83 09/24/20 0935  Temp    Pulse 83 09/24/20 0936  Resp 12 09/24/20 0936  SpO2 95 % 09/24/20 0936  Vitals shown include unvalidated device data.  Last Pain:  Vitals:   09/24/20 0752  TempSrc: Oral  PainSc: 0-No pain      Patients Stated Pain Goal: 2 (09/24/20 0752)  Complications: No complications documented.

## 2020-09-24 NOTE — Anesthesia Postprocedure Evaluation (Signed)
Anesthesia Post Note  Patient: Jon Torres  Procedure(s) Performed: [name] REDUCTION/SUBMUCOSAL RESECTION (Bilateral Nose)     Patient location during evaluation: PACU Anesthesia Type: General Level of consciousness: awake Pain management: pain level controlled Vital Signs Assessment: post-procedure vital signs reviewed and stable Respiratory status: spontaneous breathing and respiratory function stable Cardiovascular status: stable Postop Assessment: no apparent nausea or vomiting Anesthetic complications: no   No complications documented.  Last Vitals:  Vitals:   09/24/20 0948 09/24/20 1000  BP:  123/87  Pulse:  86  Resp:  16  Temp:  36.7 C  SpO2: 100% 100%    Last Pain:  Vitals:   09/24/20 1000  TempSrc:   PainSc: 0-No pain                 Mellody Dance

## 2020-09-24 NOTE — Op Note (Signed)
PREOPERATIVE DIAGNOSIS:  Turbinate hypertrophy   POSTOPERATIVE DIAGNOSIS:  Turbinate hypertrophy   PROCEDURE:  Bilateral turbinate reduction   SURGEON:  Christia Reading, MD   ANESTHESIA:  General endotracheal anesthesia   COMPLICATIONS:  None   INDICATIONS:  The patient is a 54 year old male with a history of nasal obstruction due to turbinate hypertrophy not responding to medical therapy.  He presents to the operating room for surgical management.   FINDINGS:  Enlarged turbinates   DESCRIPTION OF PROCEDURE:  The patient was identified in the holding room, informed consent having been obtained including discussion of risks, benefits and alternatives, the patient was brought to the operative suite and put the operative table in  supine position.  Anesthesia was induced and the patient was intubated by the anesthesia team without difficulty.  The eyes were taped closed.  The patient was given intravenous antibiotics during the case.  The nose was packed in both sides with Afrin-soaked pledgets for several minutes that were then removed.  The inferior turbinates were injected with local anesthetic.  An incision was made at the anterior head of each inferior turbinate and soft tissues were elevated off of the underlying bone using a freer elevator.  The microdebrider was then used with the turbinate blade to remove submucosal tissue keeping the overlying mucosa and underlying bone intact.  Soft tissues were then redraped and the turbinates out-fractured.  The nasal passage was suctioned.  Pledgets were placed back in the nose for wake-up.  Drapes were removed and the patient was cleaned off.  The patient was returned to anesthesia for wakeup, extubated, and taken to the recovery room in stable condition.

## 2020-09-25 ENCOUNTER — Encounter (HOSPITAL_BASED_OUTPATIENT_CLINIC_OR_DEPARTMENT_OTHER): Payer: Self-pay | Admitting: Otolaryngology

## 2022-08-07 ENCOUNTER — Encounter (HOSPITAL_COMMUNITY): Payer: Self-pay

## 2022-08-07 ENCOUNTER — Emergency Department (HOSPITAL_COMMUNITY): Payer: BC Managed Care – PPO

## 2022-08-07 ENCOUNTER — Other Ambulatory Visit: Payer: Self-pay

## 2022-08-07 ENCOUNTER — Emergency Department (HOSPITAL_COMMUNITY)
Admission: EM | Admit: 2022-08-07 | Discharge: 2022-08-07 | Disposition: A | Discharge status: Home / Self Care | Payer: BC Managed Care – PPO | Attending: Emergency Medicine | Admitting: Emergency Medicine

## 2022-08-07 DIAGNOSIS — I10 Essential (primary) hypertension: Secondary | ICD-10-CM | POA: Diagnosis not present

## 2022-08-07 DIAGNOSIS — W11XXXA Fall on and from ladder, initial encounter: Secondary | ICD-10-CM | POA: Insufficient documentation

## 2022-08-07 DIAGNOSIS — E119 Type 2 diabetes mellitus without complications: Secondary | ICD-10-CM | POA: Insufficient documentation

## 2022-08-07 DIAGNOSIS — S76109A Unspecified injury of unspecified quadriceps muscle, fascia and tendon, initial encounter: Secondary | ICD-10-CM | POA: Diagnosis present

## 2022-08-07 DIAGNOSIS — M25461 Effusion, right knee: Secondary | ICD-10-CM | POA: Insufficient documentation

## 2022-08-07 DIAGNOSIS — W19XXXA Unspecified fall, initial encounter: Secondary | ICD-10-CM

## 2022-08-07 MED ORDER — NAPROXEN 500 MG PO TABS: 500.0000 mg | ORAL_TABLET | Freq: Two times a day (BID) | ORAL | 0 refills | Status: AC

## 2022-08-07 NOTE — ED Triage Notes (Signed)
EMS reports from work, attempting to climb ladder and felt knee pop and then fell from 1st rung to ground on behind. No head strike, LOC, blood thinners or obvious injury.  BP 138 HR 108 RR 18 Sp02 99 RA CBG 349

## 2022-08-07 NOTE — Discharge Instructions (Addendum)
You have been seen in the Emergency Department (ED) today following a fall.  Your workup today did not reveal any injuries that require you to stay in the hospital. You can expect to be stiff and sore for the next several days.  Please take Tylenol and then naproxen as needed for pain.  Your x-ray and CT scan showed no fracture or dislocation.  However it did suggest a small tear of the quadriceps tendon where you are having pain.  Make an appointment with orthopedics calling the number that is listed in your discharge paperwork.  Do not bear weight on that knee until you follow-up with orthopedics.  You can continue to use the knee immobilizer for support.  When you are resting, rates it to help with swelling and ice it as needed.   Call your doctor or return to the ED if you develop a sudden or severe headache, confusion, slurred speech, facial droop, weakness or numbness in any arm or leg,  extreme fatigue, vomiting more than two times, severe abdominal pain, difficulty breathing or any other concerning signs or symptoms.

## 2022-08-07 NOTE — ED Provider Notes (Signed)
[facility] DEPT Provider Note   CSN: [id number] Arrival date & time: 08/07/22  1054     History  Chief Complaint  Patient presents with   Fall   Knee Pain    Jon Torres is a 56 y.o. male.  With PMH of obesity, HTN, DM 2 who presents with right knee pain after stepping up onto a ladder and feeling instability of the knee and then twisting and falling to his bottom on the ground.  He had no head trauma, no loss of consciousness is not on anticoagulation and has been unable to bear weight on his right knee since injury.  He has no history of right knee injury or surgical intervention.  He did not hear a pop or loud sound.  His pain is mainly concentrated to the distal femur region around the top of his knee.  He did not see a pop out of place nor did he move it to put it back into it any place.  He has no numbness or tingling distally or pain distally.  He has developed some pain and swelling to his right knee.   Fall  Knee Pain      Home Medications Prior to Admission medications   Medication Sig Start Date End Date Taking? Authorizing Provider  naproxen (NAPROSYN) 500 MG tablet Take 1 tablet (500 mg total) by mouth 2 (two) times daily. 08/07/22  Yes Elgie Congo, MD  diclofenac (VOLTAREN) 75 MG EC tablet Take 75 mg by mouth once.    [provider]  Dulaglutide (TRULICITY) 1.5 FG/1.8EX SOPN Inject into the skin once a week.    [provider]  empagliflozin (JARDIANCE) 10 MG TABS tablet Take by mouth daily.    [provider]  Insulin Degludec (TRESIBA) 100 UNIT/ML SOLN Inject 120 Units into the skin 2 (two) times daily after a meal.    [provider]  metFORMIN (GLUCOPHAGE) 500 MG tablet Take 500 mg by mouth 2 (two) times daily with a meal.    [provider]  pantoprazole (PROTONIX) 40 MG tablet Take 40 mg by mouth daily.    [provider]  rosuvastatin (CRESTOR) 20 MG tablet Take  20 mg by mouth daily.    [provider]  tadalafil (CIALIS) 5 MG tablet Take 5 mg by mouth daily as needed for erectile dysfunction.    [provider]  tamsulosin (FLOMAX) 0.4 MG CAPS capsule Take 0.4 mg by mouth daily.    [provider]  traZODone (DESYREL) 50 MG tablet Take 50 mg by mouth at bedtime.    [provider]      Allergies    Vytorin [ezetimibe-simvastatin]    Review of Systems   Review of Systems  Physical Exam Updated Vital Signs BP (!) 144/83   Pulse 98   Temp (!) 97.5 F (36.4 C) (Oral)   Resp 18   SpO2 99%  Physical Exam Constitutional: Alert and oriented.  Slightly uncomfortable but nontoxic Eyes: Conjunctivae are normal. ENT      Head: Normocephalic and atraumatic.      Nose: No congestion.      Mouth/Throat: Mucous membranes are moist.      Neck: No stridor.  No midline tenderness step-offs or deformities Cardiovascular: S1, S2, equal palpable DP pulses and PT pulses.Warm and well perfused. Respiratory: Normal respiratory effort.  O2 sat 99 on RA and Gastrointestinal: Soft and nontender.  Musculoskeletal: Normal range of motion in all extremities.  Right lower leg: Tenderness and swelling around right knee with most tenderness located at the distal femur at the location of the insertion of the quadriceps tendon to right patella.  Negative anterior and posterior drawer.  No instability of right knee.  Flexion of right knee intact, extension intact with tenderness by patient.  No tenderness or deformity distally.  Full sensation intact distally.      Left lower leg: No tenderness or edema. Neurologic: Normal speech and language. No gross focal neurologic deficits are appreciated. Skin: Skin is warm, dry and intact. No rash noted. Psychiatric: Mood and affect are normal. Speech and behavior are normal.  ED Results / Procedures / Treatments   Labs (all labs ordered are listed, but only abnormal results are  displayed) Labs Reviewed - No data to display  EKG None  Radiology CT Knee Right Wo Contrast  Result Date: 08/07/2022 CLINICAL DATA:  Right knee pain after injury EXAM: CT OF THE RIGHT KNEE WITHOUT CONTRAST TECHNIQUE: Multidetector CT imaging of the right knee was performed according to the standard protocol. Multiplanar CT image reconstructions were also generated. RADIATION DOSE REDUCTION: This exam was performed according to the departmental dose-optimization program which includes automated exposure control, adjustment of the mA and/or kV according to patient size and/or use of iterative reconstruction technique. COMPARISON:  X-ray 08/07/2022 FINDINGS: Bones/Joint/Cartilage Small osseous densities located several cm superior to the patella are suspicious for avulsed patellar enthesophytes related to the distal quadriceps tendon. Osseous structures appear otherwise intact. Joint spaces are relatively maintained. Small joint effusion. No fluid-fluid or fat-fluid level. No suspicious bone lesion. Ligaments Suboptimally assessed by CT. Muscles and Tendons Thickened, irregular appearance of the distal quadriceps tendon suspicious for tendinosis and possible tearing. Intact patellar tendon. Visualized musculature is within normal limits by CT. Soft tissues Moderate prepatellar soft tissue edema. No organized fluid collections. IMPRESSION: 1. Small osseous densities located superior to the patella are suspicious for avulsed patellar enthesophytes related to the distal quadriceps tendon. Thickened, irregular appearance of the distal quadriceps tendon suggests tendinosis and suspected tearing. Further evaluation can be performed with nonemergent MRI of the knee. 2. Small joint effusion. 3. Moderate prepatellar soft tissue edema. Electronically Signed   By: Duanne Guess D.O.   On: 08/07/2022 12:38   DG Knee 2 Views Right  Result Date: 08/07/2022 CLINICAL DATA:  Knee injury with pain. EXAM: RIGHT KNEE -  1-2 VIEW COMPARISON:  None Available. FINDINGS: Two views study is limited by positioning. Within this limitation, no gross fracture or dislocation evident. No substantial joint effusion. Degenerative changes are seen in the patellofemoral compartment. IMPRESSION: Limited exam due to positioning. No gross fracture or dislocation. Consider dedicated follow-up four view knee exam when patient is able. Electronically Signed   By: Kennith Center M.D.   On: 08/07/2022 11:38    Procedures Procedures   Medications Ordered in ED Medications - No data to display  ED Course/ Medical Decision Making/ A&P                           Medical Decision Making Jon Torres is a 56 y.o. male.  With PMH of obesity, HTN, DM 2 who presents with right knee pain after stepping up onto a ladder and feeling instability of the knee and then twisting and falling to his bottom on the ground.  He had no head trauma, no loss of consciousness is not on anticoagulation and has been unable to bear  weight on his right knee since injury.    Based on patient's history and physical, consider most likely tendinous or ligamentous injury of the right knee.  X-ray obtained of right knee which I personally reviewed and showed no acute fracture or dislocation but was limited.  CT scan obtained for further evaluation which showed prepatellar soft tissue edema which is where patient is having pain and findings suggestive of possible small tear of quadriceps tendon.  Based on injury and story and palpable distal pulses and neurovascularly intact leg, I am not concerned for knee dislocation, no concern for ischemic limb or arterial injury.  He had no further injury appreciated on full body exam and had no head trauma, no head CT required today.  Providing patient with knee immobilizer and crutches and referral to orthopedics and as needed naproxen.  Discussed no weightbearing until follow-up with orthopedics and strict return precautions.  He  is in agreement with plan and safe for discharge home.  Amount and/or Complexity of Data Reviewed Radiology: ordered.    Final Clinical Impression(s) / ED Diagnoses Final diagnoses:  Fall, initial encounter  Effusion of right knee  Injury of quadriceps tendon    Rx / DC Orders ED Discharge Orders          Ordered    naproxen (NAPROSYN) 500 MG tablet  2 times daily        08/07/22 1245              Mardene Sayer, MD 08/07/22 1247

## 2022-08-09 ENCOUNTER — Ambulatory Visit: Payer: Self-pay | Admitting: Physician Assistant

## 2022-08-09 ENCOUNTER — Ambulatory Visit (INDEPENDENT_AMBULATORY_CARE_PROVIDER_SITE_OTHER): Payer: BC Managed Care – PPO | Admitting: Orthopaedic Surgery

## 2022-08-09 DIAGNOSIS — S76111A Strain of right quadriceps muscle, fascia and tendon, initial encounter: Secondary | ICD-10-CM

## 2022-08-09 NOTE — Progress Notes (Signed)
Office Visit Note   Patient: Jon Torres           Date of Birth: January 11, 1966           MRN: [id number] Visit Date: 08/09/2022              Requested by: Medicine, [facility] No address on file PCP: Medicine, [facility]   Assessment & Plan: Visit Diagnoses:  1. Rupture of right quadriceps tendon, initial encounter     Plan: Impression is right acute quadriceps rupture.  X-rays and CT scan reviewed and condition discussed.  Treatment options discussed and I recommended surgical repair.  Risk benefits prognosis reviewed.  We will place him back in the knee immobilizer for now.  We will get urgent approval from Worker's [employer]. for surgery.  Follow-Up Instructions: No follow-ups on file.   Orders:  Orders Placed This Encounter  Procedures   Ambulatory Referral for DME   No orders of the defined types were placed in this encounter.     Procedures: No procedures performed   Clinical Data: No additional findings.   Subjective: Chief Complaint  Patient presents with   Right Knee - Pain    HPI Jon Torres is a 56 year old gentleman here for acute right knee injury.  Follow-up from the ED from the Sunday.  He was at work when he was coming down the ladder and felt a pop and giving way.  He was unable to extend his knee against gravity afterwards.  He was evaluated the ED and x-rays and evaluation were consistent with a quadriceps rupture.  He is currently in a knee immobilizer.  Review of Systems  Constitutional: Negative.   All other systems reviewed and are negative.    Objective: Vital Signs: There were no vitals taken for this visit.  Physical Exam Vitals and nursing note reviewed.  Constitutional:      Appearance: He is well-developed.  HENT:     Head: Normocephalic and atraumatic.  Eyes:     Pupils: Pupils are equal, round, and reactive to light.  Pulmonary:     Effort: Pulmonary effort is normal.  Abdominal:      Palpations: Abdomen is soft.  Musculoskeletal:        General: Normal range of motion.     Cervical back: Neck supple.  Skin:    General: Skin is warm.  Neurological:     Mental Status: He is alert and oriented to person, place, and time.  Psychiatric:        Behavior: Behavior normal.        Thought Content: Thought content normal.        Judgment: Judgment normal.     Ortho Exam Examination of the right knee shows moderate joint effusion.  Very tender to the superior portion of the patella.  He is unable to extend his knee against gravity.  No joint line tenderness.  Collaterals and cruciates are stable.  Specialty Comments:  No specialty comments available.  Imaging: No results found.   PMFS History: There are no problems to display for this patient.  Past Medical History:  Diagnosis Date   Diabetes mellitus without complication (HCC)    GERD (gastroesophageal reflux disease)     No family history on file.  Past Surgical History:  Procedure Laterality Date   NASAL SEPTUM SURGERY     NASAL TURBINATE REDUCTION Bilateral 09/24/2020   Procedure: TURBINATE REDUCTION/SUBMUCOSAL RESECTION;  Surgeon: Christia Reading, MD;  Location:  [place];  Service: ENT;  Laterality: Bilateral;   TONSILLECTOMY     VASECTOMY     vericocele     as a child   Social History   Occupational History   Not on file  Tobacco Use   Smoking status: Never   Smokeless tobacco: Never  Vaping Use   Vaping Use: Never used  Substance and Sexual Activity   Alcohol use: Yes    Comment: 1 week   Drug use: Never   Sexual activity: Not on file

## 2022-08-19 ENCOUNTER — Telehealth: Payer: Self-pay

## 2022-08-19 ENCOUNTER — Ambulatory Visit: Payer: BC Managed Care – PPO

## 2022-08-19 ENCOUNTER — Encounter (HOSPITAL_BASED_OUTPATIENT_CLINIC_OR_DEPARTMENT_OTHER): Payer: Self-pay | Admitting: Orthopaedic Surgery

## 2022-08-19 ENCOUNTER — Other Ambulatory Visit: Payer: Self-pay

## 2022-08-19 ENCOUNTER — Encounter: Payer: Self-pay | Admitting: Orthopaedic Surgery

## 2022-08-19 NOTE — Progress Notes (Signed)
   08/19/22 1448  Pre-op Phone Call  Surgery Date Verified 08/24/22  Arrival Time Verified 7654  Surgery Location Verified [facility] [place]  Medical History Reviewed Yes  Does the patient have diabetes? Type II  Does the patient use a Continuous Blood Glucose Monitor? No  Is the patient on an insulin pump? No  Has the diabetes coordinator been notified? No  Do you have a history of heart problems? No  Patient educated on enhanced recovery. Yes  Patient educated about smoking cessation 24 hours prior to surgery. N/A Non-Smoker  Patient verbalizes understanding of bowel prep? N/A  Med Rec Completed Yes  Take the Following Meds the Morning of Surgery last dose of Trulicity 65/03.  Do not take cialis x 48hrs prior to surgery, Protonix dos, do not take metformin or insulin dos. (May half nightly insulin 12/31 if he prefers)  Recent  Lab Work, EKG, CXR? No  NPO (Including gum & candy) After midnight  Allowed clear liquids Water;Gatorade  (diabetics please choose diet or no sugar options)  Patient instructed to stop clear liquids including Carb loading drink at: 1045 (if blood sugar low prior to arrival sprite or gatorade.)  Stop Solids, Milk, Candy, and Gum STARTING AT MIDNIGHT (Protein snack prior to bed the night before surgery.)  Responsible adult to drive and be with you for 24 hours? Yes  Name & Phone Number for Ride/Caregiver Gay Filler wife  No Jewelry, money, nail polish or make-up.  No lotions, powders, perfumes. No shaving  48 hrs. prior to surgery. Yes  Contacts, Dentures & Glasses Will Have to be Removed Before OR. Yes  Please bring your ID and Insurance Card the morning of your surgery. (Surgery Centers Only) Yes  Bring any papers or x-rays with you that your surgeon gave you. Yes  Instructed to contact the location of procedure/ provider if they or anyone in their household develops symptoms or tests positive for COVID-19, has close contact with someone who tests positive for COVID, or has known  exposure to any contagious illness. Yes  Call this number the morning of surgery  with any problems that may cancel your surgery. [phone]

## 2022-08-19 NOTE — Progress Notes (Signed)
Patient came in and was fitted with a Bledsoe brace. Locked at extension 0degreee.

## 2022-08-19 NOTE — Telephone Encounter (Signed)
Can you please send in some pain medicine for patient? He is scheduled for a quadriceps repair next week.

## 2022-08-21 ENCOUNTER — Other Ambulatory Visit: Payer: Self-pay | Admitting: Physician Assistant

## 2022-08-21 MED ORDER — HYDROCODONE-ACETAMINOPHEN 5-325 MG PO TABS: 1.0000 | ORAL_TABLET | Freq: Three times a day (TID) | ORAL | 0 refills | Status: AC | PRN

## 2022-08-21 NOTE — Telephone Encounter (Signed)
Sent in

## 2022-08-22 ENCOUNTER — Encounter (HOSPITAL_BASED_OUTPATIENT_CLINIC_OR_DEPARTMENT_OTHER)
Admission: RE | Admit: 2022-08-22 | Discharge: 2022-08-22 | Disposition: A | Discharge status: Home / Self Care | Payer: BC Managed Care – PPO | Source: Ambulatory Visit | Attending: Orthopaedic Surgery | Admitting: Orthopaedic Surgery

## 2022-08-22 DIAGNOSIS — Z6832 Body mass index (BMI) 32.0-32.9, adult: Secondary | ICD-10-CM | POA: Diagnosis not present

## 2022-08-22 DIAGNOSIS — S76111A Strain of right quadriceps muscle, fascia and tendon, initial encounter: Secondary | ICD-10-CM | POA: Diagnosis present

## 2022-08-22 DIAGNOSIS — Z01818 Encounter for other preprocedural examination: Secondary | ICD-10-CM | POA: Insufficient documentation

## 2022-08-22 DIAGNOSIS — E669 Obesity, unspecified: Secondary | ICD-10-CM | POA: Diagnosis not present

## 2022-08-22 DIAGNOSIS — Z794 Long term (current) use of insulin: Secondary | ICD-10-CM | POA: Diagnosis not present

## 2022-08-22 DIAGNOSIS — E119 Type 2 diabetes mellitus without complications: Secondary | ICD-10-CM | POA: Diagnosis not present

## 2022-08-22 DIAGNOSIS — Z7984 Long term (current) use of oral hypoglycemic drugs: Secondary | ICD-10-CM | POA: Diagnosis not present

## 2022-08-22 DIAGNOSIS — X58XXXA Exposure to other specified factors, initial encounter: Secondary | ICD-10-CM | POA: Diagnosis not present

## 2022-08-22 DIAGNOSIS — K219 Gastro-esophageal reflux disease without esophagitis: Secondary | ICD-10-CM | POA: Diagnosis not present

## 2022-08-22 LAB — BASIC METABOLIC PANEL
Anion gap: 9 (ref 5–15)
BUN: 24 mg/dL — ABNORMAL HIGH (ref 6–20)
CO2: 25 mmol/L (ref 22–32)
Calcium: 9.5 mg/dL (ref 8.9–10.3)
Chloride: 106 mmol/L (ref 98–111)
Creatinine, Ser: 1.27 mg/dL — ABNORMAL HIGH (ref 0.61–1.24)
GFR, Estimated: >60 mL/min (ref >60)
Glucose, Bld: 176 mg/dL — ABNORMAL HIGH (ref 70–99)
Potassium: 5.5 mmol/L — ABNORMAL HIGH (ref 3.5–5.1)
Sodium: 140 mmol/L (ref 135–145)

## 2022-08-22 NOTE — Progress Notes (Signed)

## 2022-08-23 NOTE — Progress Notes (Signed)
K+ 5.5, Dr. Kalman Shan aware, will repeat day of surgery.

## 2022-08-24 ENCOUNTER — Encounter (HOSPITAL_BASED_OUTPATIENT_CLINIC_OR_DEPARTMENT_OTHER): Payer: Self-pay | Admitting: Orthopaedic Surgery

## 2022-08-24 ENCOUNTER — Other Ambulatory Visit: Payer: Self-pay

## 2022-08-24 ENCOUNTER — Ambulatory Visit (HOSPITAL_BASED_OUTPATIENT_CLINIC_OR_DEPARTMENT_OTHER): Payer: BC Managed Care – PPO | Admitting: Anesthesiology

## 2022-08-24 ENCOUNTER — Ambulatory Visit (HOSPITAL_BASED_OUTPATIENT_CLINIC_OR_DEPARTMENT_OTHER)
Admission: RE | Admit: 2022-08-24 | Discharge: 2022-08-24 | Disposition: A | Discharge status: Home / Self Care | Payer: BC Managed Care – PPO | Attending: Orthopaedic Surgery | Admitting: Orthopaedic Surgery

## 2022-08-24 ENCOUNTER — Encounter (HOSPITAL_BASED_OUTPATIENT_CLINIC_OR_DEPARTMENT_OTHER)
Admission: RE | Disposition: A | Discharge status: Home / Self Care | Payer: Self-pay | Source: Home / Self Care | Attending: Orthopaedic Surgery

## 2022-08-24 DIAGNOSIS — K219 Gastro-esophageal reflux disease without esophagitis: Secondary | ICD-10-CM | POA: Insufficient documentation

## 2022-08-24 DIAGNOSIS — Z7984 Long term (current) use of oral hypoglycemic drugs: Secondary | ICD-10-CM | POA: Insufficient documentation

## 2022-08-24 DIAGNOSIS — S76111A Strain of right quadriceps muscle, fascia and tendon, initial encounter: Secondary | ICD-10-CM | POA: Diagnosis not present

## 2022-08-24 DIAGNOSIS — Z6832 Body mass index (BMI) 32.0-32.9, adult: Secondary | ICD-10-CM | POA: Insufficient documentation

## 2022-08-24 DIAGNOSIS — X58XXXA Exposure to other specified factors, initial encounter: Secondary | ICD-10-CM | POA: Insufficient documentation

## 2022-08-24 DIAGNOSIS — Z794 Long term (current) use of insulin: Secondary | ICD-10-CM | POA: Insufficient documentation

## 2022-08-24 DIAGNOSIS — E119 Type 2 diabetes mellitus without complications: Secondary | ICD-10-CM | POA: Insufficient documentation

## 2022-08-24 DIAGNOSIS — Z01818 Encounter for other preprocedural examination: Secondary | ICD-10-CM

## 2022-08-24 DIAGNOSIS — E669 Obesity, unspecified: Secondary | ICD-10-CM | POA: Insufficient documentation

## 2022-08-24 HISTORY — PX: QUADRICEPS TENDON REPAIR: SHX756

## 2022-08-24 HISTORY — DX: Hyperlipidemia, unspecified: E78.5

## 2022-08-24 LAB — GLUCOSE, CAPILLARY
Glucose-Capillary: 163 mg/dL — ABNORMAL HIGH (ref 70–99)
Glucose-Capillary: 165 mg/dL — ABNORMAL HIGH (ref 70–99)

## 2022-08-24 LAB — BASIC METABOLIC PANEL
Anion gap: 10 (ref 5–15)
BUN: 15 mg/dL (ref 6–20)
CO2: 25 mmol/L (ref 22–32)
Calcium: 8.6 mg/dL — ABNORMAL LOW (ref 8.9–10.3)
Chloride: 104 mmol/L (ref 98–111)
Creatinine, Ser: 0.98 mg/dL (ref 0.61–1.24)
GFR, Estimated: >60 mL/min (ref >60)
Glucose, Bld: 138 mg/dL — ABNORMAL HIGH (ref 70–99)
Potassium: 4.4 mmol/L (ref 3.5–5.1)
Sodium: 139 mmol/L (ref 135–145)

## 2022-08-24 SURGERY — REPAIR, TENDON, QUADRICEPS
Anesthesia: General | Site: Leg Upper | Laterality: Right

## 2022-08-24 MED ORDER — LACTATED RINGERS IV SOLN: INTRAVENOUS | Status: DC

## 2022-08-24 MED ORDER — MIDAZOLAM HCL 2 MG/2ML IJ SOLN
INTRAMUSCULAR | Status: AC
  Filled 2022-08-24: qty 2

## 2022-08-24 MED ORDER — ONDANSETRON HCL 4 MG PO TABS: 4.0000 mg | ORAL_TABLET | Freq: Three times a day (TID) | ORAL | 0 refills | Status: AC | PRN

## 2022-08-24 MED ORDER — LIDOCAINE HCL (CARDIAC) PF 100 MG/5ML IV SOSY
PREFILLED_SYRINGE | INTRAVENOUS | Status: DC | PRN
  Administered 2022-08-24: 20 mg via INTRAVENOUS

## 2022-08-24 MED ORDER — ONDANSETRON HCL 4 MG/2ML IJ SOLN
INTRAMUSCULAR | Status: DC | PRN
  Administered 2022-08-24: 4 mg via INTRAVENOUS

## 2022-08-24 MED ORDER — OXYCODONE HCL 5 MG PO TABS
ORAL_TABLET | ORAL | Status: AC
  Filled 2022-08-24: qty 1

## 2022-08-24 MED ORDER — FENTANYL CITRATE (PF) 100 MCG/2ML IJ SOLN
INTRAMUSCULAR | Status: AC
  Filled 2022-08-24: qty 2

## 2022-08-24 MED ORDER — FENTANYL CITRATE (PF) 100 MCG/2ML IJ SOLN: 25.0000 ug | INTRAMUSCULAR | Status: DC | PRN

## 2022-08-24 MED ORDER — FENTANYL CITRATE (PF) 100 MCG/2ML IJ SOLN
INTRAMUSCULAR | Status: DC | PRN
  Administered 2022-08-24: 50 ug via INTRAVENOUS

## 2022-08-24 MED ORDER — CEFAZOLIN SODIUM-DEXTROSE 2-4 GM/100ML-% IV SOLN
INTRAVENOUS | Status: AC
  Filled 2022-08-24: qty 100

## 2022-08-24 MED ORDER — ACETAMINOPHEN 500 MG PO TABS
ORAL_TABLET | ORAL | Status: AC
  Filled 2022-08-24: qty 2

## 2022-08-24 MED ORDER — ACETAMINOPHEN 160 MG/5ML PO SOLN: 325.0000 mg | ORAL | Status: DC | PRN

## 2022-08-24 MED ORDER — OXYCODONE HCL 5 MG PO TABS: 5.0000 mg | ORAL_TABLET | Freq: Three times a day (TID) | ORAL | 0 refills | Status: AC | PRN

## 2022-08-24 MED ORDER — ROPIVACAINE HCL 5 MG/ML IJ SOLN
INTRAMUSCULAR | Status: DC | PRN
  Administered 2022-08-24: 25 mL via PERINEURAL

## 2022-08-24 MED ORDER — LIDOCAINE 2% (20 MG/ML) 5 ML SYRINGE
INTRAMUSCULAR | Status: AC
  Filled 2022-08-24: qty 5

## 2022-08-24 MED ORDER — OXYCODONE HCL 5 MG PO TABS
5.0000 mg | ORAL_TABLET | Freq: Once | ORAL | Status: AC | PRN
  Administered 2022-08-24: 5 mg via ORAL

## 2022-08-24 MED ORDER — DEXAMETHASONE SODIUM PHOSPHATE 10 MG/ML IJ SOLN
INTRAMUSCULAR | Status: DC | PRN
  Administered 2022-08-24: 5 mg via INTRAVENOUS

## 2022-08-24 MED ORDER — ACETAMINOPHEN 325 MG PO TABS: 325.0000 mg | ORAL_TABLET | ORAL | Status: DC | PRN

## 2022-08-24 MED ORDER — CELECOXIB 200 MG PO CAPS
ORAL_CAPSULE | ORAL | Status: AC
  Filled 2022-08-24: qty 1

## 2022-08-24 MED ORDER — PROPOFOL 10 MG/ML IV BOLUS
INTRAVENOUS | Status: AC
  Filled 2022-08-24: qty 20

## 2022-08-24 MED ORDER — MIDAZOLAM HCL 2 MG/2ML IJ SOLN
2.0000 mg | Freq: Once | INTRAMUSCULAR | Status: AC
  Administered 2022-08-24: 2 mg via INTRAVENOUS

## 2022-08-24 MED ORDER — OXYCODONE HCL 5 MG/5ML PO SOLN: 5.0000 mg | Freq: Once | ORAL | Status: AC | PRN

## 2022-08-24 MED ORDER — CEFAZOLIN SODIUM-DEXTROSE 2-4 GM/100ML-% IV SOLN
2.0000 g | INTRAVENOUS | Status: AC
  Administered 2022-08-24: 2 g via INTRAVENOUS

## 2022-08-24 MED ORDER — 0.9 % SODIUM CHLORIDE (POUR BTL) OPTIME
TOPICAL | Status: DC | PRN
  Administered 2022-08-24: 1000 mL

## 2022-08-24 MED ORDER — CELECOXIB 200 MG PO CAPS
200.0000 mg | ORAL_CAPSULE | Freq: Once | ORAL | Status: AC
  Administered 2022-08-24: 200 mg via ORAL

## 2022-08-24 MED ORDER — DEXAMETHASONE SODIUM PHOSPHATE 10 MG/ML IJ SOLN
INTRAMUSCULAR | Status: DC | PRN
  Administered 2022-08-24: 10 mg

## 2022-08-24 MED ORDER — OXYCODONE HCL 5 MG PO TABS
5.0000 mg | ORAL_TABLET | Freq: Once | ORAL | Status: AC
  Administered 2022-08-24: 5 mg via ORAL

## 2022-08-24 MED ORDER — MEPERIDINE HCL 25 MG/ML IJ SOLN: 6.2500 mg | INTRAMUSCULAR | Status: DC | PRN

## 2022-08-24 MED ORDER — ONDANSETRON HCL 4 MG/2ML IJ SOLN
INTRAMUSCULAR | Status: AC
  Filled 2022-08-24: qty 2

## 2022-08-24 MED ORDER — ACETAMINOPHEN 500 MG PO TABS
1000.0000 mg | ORAL_TABLET | Freq: Once | ORAL | Status: AC
  Administered 2022-08-24: 1000 mg via ORAL

## 2022-08-24 MED ORDER — IBUPROFEN 800 MG PO TABS: 800.0000 mg | ORAL_TABLET | Freq: Three times a day (TID) | ORAL | 2 refills | Status: AC | PRN

## 2022-08-24 MED ORDER — ONDANSETRON HCL 4 MG/2ML IJ SOLN: 4.0000 mg | Freq: Once | INTRAMUSCULAR | Status: DC | PRN

## 2022-08-24 MED ORDER — PROPOFOL 10 MG/ML IV BOLUS
INTRAVENOUS | Status: DC | PRN
  Administered 2022-08-24: 200 mg via INTRAVENOUS

## 2022-08-24 MED ORDER — FENTANYL CITRATE (PF) 100 MCG/2ML IJ SOLN
100.0000 ug | Freq: Once | INTRAMUSCULAR | Status: AC
  Administered 2022-08-24: 100 ug via INTRAVENOUS

## 2022-08-24 SURGICAL SUPPLY — 70 items
BANDAGE ESMARK 6X9 LF (GAUZE/BANDAGES/DRESSINGS) ×1 IMPLANT
BLADE HEX COATED 2.75 (ELECTRODE) ×1 IMPLANT
BLADE SURG 10 STRL SS (BLADE) IMPLANT
BLADE SURG 15 STRL LF DISP TIS (BLADE) ×2 IMPLANT
BLADE SURG 15 STRL SS (BLADE) ×3
BNDG CMPR 9X6 STRL LF SNTH (GAUZE/BANDAGES/DRESSINGS) ×1
BNDG ELASTIC 6X5.8 VLCR STR LF (GAUZE/BANDAGES/DRESSINGS) ×2 IMPLANT
BNDG ESMARK 6X9 LF (GAUZE/BANDAGES/DRESSINGS) ×1
BNDG GAUZE DERMACEA FLUFF 4 (GAUZE/BANDAGES/DRESSINGS) ×1 IMPLANT
BNDG GZE DERMACEA 4 6PLY (GAUZE/BANDAGES/DRESSINGS)
BRUSH SCRUB EZ PLAIN DRY (MISCELLANEOUS) ×1 IMPLANT
CANISTER SUCT 1200ML W/VALVE (MISCELLANEOUS) IMPLANT
CLSR STERI-STRIP ANTIMIC 1/2X4 (GAUZE/BANDAGES/DRESSINGS) ×1 IMPLANT
CUFF TOURN SGL QUICK 34 (TOURNIQUET CUFF) ×1
CUFF TRNQT CYL 34X4.125X (TOURNIQUET CUFF) IMPLANT
DRAPE EXTREMITY T 121X128X90 (DISPOSABLE) ×1 IMPLANT
DRAPE SURG 17X23 STRL (DRAPES) ×2 IMPLANT
DRAPE U-SHAPE 47X51 STRL (DRAPES) IMPLANT
DRSG AQUACEL AG ADV 3.5X10 (GAUZE/BANDAGES/DRESSINGS) IMPLANT
DURAPREP 26ML APPLICATOR (WOUND CARE) ×1 IMPLANT
ELECT REM PT RETURN 9FT ADLT (ELECTROSURGICAL) ×1
ELECTRODE REM PT RTRN 9FT ADLT (ELECTROSURGICAL) ×1 IMPLANT
GAUZE SPONGE 4X4 12PLY STRL (GAUZE/BANDAGES/DRESSINGS) ×1 IMPLANT
GAUZE XEROFORM 1X8 LF (GAUZE/BANDAGES/DRESSINGS) IMPLANT
GLOVE ECLIPSE 7.0 STRL STRAW (GLOVE) ×1 IMPLANT
GLOVE INDICATOR 7.0 STRL GRN (GLOVE) ×1 IMPLANT
GLOVE INDICATOR 7.5 STRL GRN (GLOVE) ×1 IMPLANT
GLOVE SURG SYN 7.5  E (GLOVE) ×1
GLOVE SURG SYN 7.5 E (GLOVE) ×1 IMPLANT
GLOVE SURG SYN 7.5 PF PI (GLOVE) ×1 IMPLANT
GOWN STRL REUS W/ TWL LRG LVL3 (GOWN DISPOSABLE) ×1 IMPLANT
GOWN STRL REUS W/ TWL XL LVL3 (GOWN DISPOSABLE) ×1 IMPLANT
GOWN STRL REUS W/TWL LRG LVL3 (GOWN DISPOSABLE) ×1
GOWN STRL REUS W/TWL XL LVL3 (GOWN DISPOSABLE) ×1
GOWN STRL SURGICAL XL XLNG (GOWN DISPOSABLE) ×1 IMPLANT
IMMOBILIZER KNEE 22 UNIV (SOFTGOODS) IMPLANT
IMMOBILIZER KNEE 24 THIGH 36 (MISCELLANEOUS) IMPLANT
IMMOBILIZER KNEE 24 UNIV (MISCELLANEOUS)
MANIFOLD NEPTUNE II (INSTRUMENTS) IMPLANT
NS IRRIG 1000ML POUR BTL (IV SOLUTION) IMPLANT
PACK ARTHROSCOPY DSU (CUSTOM PROCEDURE TRAY) ×1 IMPLANT
PACK BASIN DAY SURGERY FS (CUSTOM PROCEDURE TRAY) ×1 IMPLANT
PADDING CAST COTTON 6X4 STRL (CAST SUPPLIES) IMPLANT
PADDING CAST SYNTHETIC 4X4 STR (CAST SUPPLIES) IMPLANT
PADDING CAST SYNTHETIC 6X4 NS (CAST SUPPLIES) IMPLANT
PENCIL SMOKE EVACUATOR (MISCELLANEOUS) ×1 IMPLANT
RETRIEVER SUT HEWSON (MISCELLANEOUS) IMPLANT
SHEET MEDIUM DRAPE 40X70 STRL (DRAPES) ×1 IMPLANT
SLEEVE SCD COMPRESS KNEE MED (STOCKING) IMPLANT
SPIKE FLUID TRANSFER (MISCELLANEOUS) IMPLANT
SPONGE T-LAP 18X18 ~~LOC~~+RFID (SPONGE) IMPLANT
SUCTION FRAZIER HANDLE 10FR (MISCELLANEOUS)
SUCTION TUBE FRAZIER 10FR DISP (MISCELLANEOUS) IMPLANT
SUT ETHILON 2 0 FS 18 (SUTURE) IMPLANT
SUT FIBERWIRE #2 38 T-5 BLUE (SUTURE) ×2
SUT FIBERWIRE #5 38 CONV NDL (SUTURE)
SUT MNCRL AB 3-0 PS2 18 (SUTURE) ×1 IMPLANT
SUT VIC AB 0 CT1 27 (SUTURE)
SUT VIC AB 0 CT1 27XBRD ANBCTR (SUTURE) IMPLANT
SUT VIC AB 2-0 CT1 27 (SUTURE) ×3
SUT VIC AB 2-0 CT1 TAPERPNT 27 (SUTURE) ×1 IMPLANT
SUTURE FIBERWR #2 38 T-5 BLUE (SUTURE) IMPLANT
SUTURE FIBERWR #5 38 CONV NDL (SUTURE) IMPLANT
SUTURE TAPE 1.3 40 TPR END (SUTURE) IMPLANT
SUTURETAPE 1.3 40 TPR END (SUTURE)
SYR BULB EAR ULCER 3OZ GRN STR (SYRINGE) IMPLANT
TOWEL GREEN STERILE FF (TOWEL DISPOSABLE) ×1 IMPLANT
TRAY DSU PREP LF (CUSTOM PROCEDURE TRAY) ×1 IMPLANT
WRAP KNEE MAXI GEL POST OP (GAUZE/BANDAGES/DRESSINGS) ×1 IMPLANT
YANKAUER SUCT BULB TIP NO VENT (SUCTIONS) ×1 IMPLANT

## 2022-08-24 NOTE — Transfer of Care (Signed)
Immediate Anesthesia Transfer of Care Note  Patient: Jon Torres  Procedure(s) Performed: RIGHT QUADRICEPS REPAIR (Right: Leg Upper)  Patient Location: PACU  Anesthesia Type:GA combined with regional for post-op pain  Level of Consciousness: sedated  Airway & Oxygen Therapy: Patient Spontanous Breathing and Patient connected to face mask oxygen  Post-op Assessment: Report given to RN and Post -op Vital signs reviewed and stable  Post vital signs: Reviewed and stable  Last Vitals:  Vitals Value Taken Time  BP    Temp 36.2 C 08/24/22 1602  Pulse 83 08/24/22 1603  Resp 11 08/24/22 1604  SpO2 99 % 08/24/22 1603  Vitals shown include unvalidated device data.  Last Pain:  Vitals:   08/24/22 1319  TempSrc: Oral  PainSc: 0-No pain      Patients Stated Pain Goal: 2 (90/24/09 7353)  Complications: No notable events documented.

## 2022-08-24 NOTE — Anesthesia Procedure Notes (Addendum)
  Anesthesia Regional Block: Adductor canal block   Pre-Anesthetic Checklist: , timeout performed,  Correct Patient, Correct Site, Correct Laterality,  Correct Procedure, Correct Position, site marked,  Risks and benefits discussed,  Surgical consent,  Pre-op evaluation,  At surgeon's request and post-op pain management  Laterality: Right  Prep: chloraprep       Needles:  Injection technique: Single-shot  Needle Type: Echogenic Stimulator Needle     Needle Length: 5cm  Needle Gauge: 22     Additional Needles:   Procedures:,,,, ultrasound used (permanent image in chart),,    Narrative:  Start time: 08/24/2022 2:18 PM End time: 08/24/2022 2:24 PM Injection made incrementally with aspirations every 5 mL.  Performed by: Personally  Anesthesiologist: Janeece Riggers, MD  Additional Notes: Functioning IV was confirmed and monitors were applied.  A 25mm 22ga Arrow echogenic stimulator needle was used. Sterile prep and drape,hand hygiene and sterile gloves were used. Ultrasound guidance: relevant anatomy identified, needle position confirmed, local anesthetic spread visualized around nerve(s)., vascular puncture avoided.  Image printed for medical record. Negative aspiration and negative test dose prior to incremental administration of local anesthetic. The patient tolerated the procedure well.

## 2022-08-24 NOTE — H&P (Signed)
PREOPERATIVE H&P  Chief Complaint: right quadriceps rupture  HPI: Jon Torres is a 56 y.o. male who presents for surgical treatment of right quadriceps rupture.  He denies any changes in medical history.  Past Medical History:  Diagnosis Date   Diabetes mellitus without complication (HCC)    GERD (gastroesophageal reflux disease)    HLD (hyperlipidemia)    Past Surgical History:  Procedure Laterality Date   NASAL SEPTUM SURGERY     NASAL TURBINATE REDUCTION Bilateral 09/24/2020   Procedure: TURBINATE REDUCTION/SUBMUCOSAL RESECTION;  Surgeon: Melida Quitter, MD;  Location: [facility];  Service: ENT;  Laterality: Bilateral;   [place]     as a child   Social History   Socioeconomic History   Marital status: Married    Spouse name: Gay Filler   Number of children: Not on file   Years of education: Not on file   Highest education level: Not on file  Occupational History   Not on file  Tobacco Use   Smoking status: Never   Smokeless tobacco: Never  Vaping Use   Vaping Use: Never used  Substance and Sexual Activity   Alcohol use: Yes    Comment: 1 week   Drug use: Never   Sexual activity: Not on file  Other Topics Concern   Not on file  Social History Narrative   Not on file   Social Determinants of Health   Financial Resource Strain: Not on file  Food Insecurity: Not on file  Transportation Needs: Not on file  Physical Activity: Not on file  Stress: Not on file  Social Connections: Not on file   History reviewed. No pertinent family history. Allergies  Allergen Reactions   Vytorin [Ezetimibe-Simvastatin] Rash   Prior to Admission medications   Medication Sig Start Date End Date Taking? Authorizing Provider  diclofenac (VOLTAREN) 75 MG EC tablet Take 75 mg by mouth once.   Yes [provider]  Dulaglutide (TRULICITY) 1.5 PJ/8.2NK SOPN Inject into the skin once a week.   Yes [provider]   empagliflozin (JARDIANCE) 10 MG TABS tablet Take by mouth at bedtime.   Yes [provider]  Insulin Degludec (TRESIBA) 100 UNIT/ML SOLN Inject 120 Units into the skin at bedtime.   Yes [provider]  metFORMIN (GLUCOPHAGE) 500 MG tablet Take 500 mg by mouth 2 (two) times daily with a meal.   Yes [provider]  naproxen (NAPROSYN) 500 MG tablet Take 1 tablet (500 mg total) by mouth 2 (two) times daily. 08/07/22  Yes Elgie Congo, MD  pantoprazole (PROTONIX) 40 MG tablet Take 40 mg by mouth daily.   Yes [provider]  rosuvastatin (CRESTOR) 20 MG tablet Take 20 mg by mouth daily.   Yes [provider]  tadalafil (CIALIS) 5 MG tablet Take 5 mg by mouth daily as needed for erectile dysfunction.   Yes [provider]  zolpidem (AMBIEN) 10 MG tablet Take 10 mg by mouth at bedtime as needed for sleep.   Yes [provider]  HYDROcodone-acetaminophen (NORCO) 5-325 MG tablet Take 1 tablet by mouth 3 (three) times daily as needed. 08/21/22   Aundra Dubin, PA-C     Positive ROS: All other systems have been reviewed and were otherwise negative with the exception of those mentioned in the HPI and as above.  Physical Exam: General: Alert, no acute distress Cardiovascular: No pedal edema Respiratory: No  cyanosis, no use of accessory musculature GI: abdomen soft Skin: No lesions in the area of chief complaint Neurologic: Sensation intact distally Psychiatric: Patient is competent for consent with normal mood and affect Lymphatic: no lymphedema  MUSCULOSKELETAL: exam stable  Assessment: right quadriceps rupture  Plan: Plan for Procedure(s): RIGHT QUADRICEPS REPAIR  The risks benefits and alternatives were discussed with the patient including but not limited to the risks of nonoperative treatment, versus surgical intervention including infection, bleeding, nerve injury,  blood clots, cardiopulmonary complications,  morbidity, mortality, among others, and they were willing to proceed.   Eduard Roux, MD 08/24/2022 2:02 PM

## 2022-08-24 NOTE — Discharge Instructions (Signed)

## 2022-08-24 NOTE — Op Note (Signed)
   Date of Surgery: 08/24/2022  INDICATIONS: Jon Torres is a 56 y.o.-year-old male with a right quadriceps rupture.  The patient did consent to the procedure after discussion of the risks and benefits.  PREOPERATIVE DIAGNOSIS: Right quadriceps rupture  POSTOPERATIVE DIAGNOSIS: Same.  PROCEDURE: Repair of right quadriceps rupture through bone tunnels  SURGEON: N. Eduard Roux, M.D.  ASSIST: Ciro Backer [place], [place]; necessary for the timely completion of procedure and due to complexity of procedure.  ANESTHESIA:  general  IV FLUIDS AND URINE: See anesthesia.  ESTIMATED BLOOD LOSS: 25 mL.  IMPLANTS: #2 fiberwire x 2  DRAINS: none  COMPLICATIONS: see description of procedure.  DESCRIPTION OF PROCEDURE: The patient was brought to the operating room.  The patient had been signed prior to the procedure and this was documented. The patient had the anesthesia placed by the anesthesiologist.  A time-out was performed to confirm that this was the correct patient, site, side and location. The patient did receive antibiotics prior to the incision and was re-dosed during the procedure as needed at indicated intervals.  A tourniquet was placed.  The patient had the operative extremity prepped and draped in the standard surgical fashion.    A longitudinal incision was made directly over the knee and full-thickness flaps were raised off of the quadriceps tendon and the patella.  Traumatic seroma was encountered.  This was evacuated and a full-thickness complete quadriceps rupture was encountered.  Fortunately the tendon quality itself was very good and there was not any interstitial tearing.  There was plenty of tendon to work with further repair.  The end of the tendon was prepared sharply with a 15 blade.  The remaining tendon was removed from the attachment from the superior pole of the patella and the underlying bone was prepared back to a bleeding bed of bone.  3 parallel bone tunnels were drilled  across the patella from superior to inferior.  Two #2 FiberWire sutures were advanced at the quadriceps tendon in a locking Krakw fashion to produce 4 parallel strands.  The central 2 strands were placed into the middle hole and the other peripheral sutures were placed to the peripheral tunnels.  The sutures were then tied inferiorly and there was good approximation of the tendon to the patella.  The lateral and medial retinaculum were repaired with interrupted 0 Vicryl.  The surgical site was thoroughly irrigated and a layered closure was performed using 0 Vicryl 2-0 Vicryl and 3-0 Monocryl.  Sterile dressings were applied.  Bledsoe brace was placed with the range of motion from 0 to 30 degrees.  Patient tolerated procedure well had no immediate complications.  Tawanna Cooler was necessary for opening, closing, retracting, limb positioning and overall facilitation and timely completion of the procedure.  POSTOPERATIVE PLAN: Patient will be discharged home today.  He can weight-bear as tolerated in the Bledsoe brace.  He is to follow-up in the office in 2 weeks for recheck.  Azucena Cecil, MD 3:37 PM

## 2022-08-24 NOTE — Anesthesia Procedure Notes (Signed)
Procedure Name: LMA Insertion Date/Time: 08/24/2022 2:59 PM  Performed by: Tawni Millers, CRNAPre-anesthesia Checklist: Patient identified, Emergency Drugs available, Suction available and Patient being monitored Patient Re-evaluated:Patient Re-evaluated prior to induction Oxygen Delivery Method: Circle system utilized Preoxygenation: Pre-oxygenation with 100% oxygen Induction Type: IV induction Ventilation: Mask ventilation without difficulty LMA: LMA inserted LMA Size: 4.0 Number of attempts: 1 Airway Equipment and Method: Bite block Placement Confirmation: positive ETCO2 Tube secured with: Tape Dental Injury: Teeth and Oropharynx as per pre-operative assessment

## 2022-08-24 NOTE — Anesthesia Postprocedure Evaluation (Signed)
Anesthesia Post Note  Patient: Jon Torres  Procedure(s) Performed: RIGHT QUADRICEPS REPAIR (Right: Leg Upper)     Patient location during evaluation: PACU Anesthesia Type: General Level of consciousness: awake and alert Pain management: pain level controlled Vital Signs Assessment: post-procedure vital signs reviewed and stable Respiratory status: spontaneous breathing, nonlabored ventilation, respiratory function stable and patient connected to nasal cannula oxygen Cardiovascular status: blood pressure returned to baseline and stable Postop Assessment: no apparent nausea or vomiting Anesthetic complications: no   No notable events documented.  Last Vitals:  Vitals:   08/24/22 1420 08/24/22 1602  BP: 123/76   Pulse: 77   Resp: 15   Temp:  (!) 36.2 C  SpO2: 95%     Last Pain:  Vitals:   08/24/22 1319  TempSrc: Oral  PainSc: 0-No pain                 [name]

## 2022-08-24 NOTE — Anesthesia Preprocedure Evaluation (Addendum)
Anesthesia Evaluation  Patient identified by MRN, date of birth, ID band Patient awake    Reviewed: Allergy & Precautions, NPO status , Patient's Chart, lab work & pertinent test results  Airway Mallampati: III  TM Distance: >3 FB Neck ROM: Full  Mouth opening: Limited Mouth Opening  Dental no notable dental hx. (+) Teeth Intact, Dental Advisory Given   Pulmonary neg pulmonary ROS,    Pulmonary exam normal breath sounds clear to auscultation       Cardiovascular Exercise Tolerance: Good negative cardio ROS Normal cardiovascular exam Rhythm:Regular Rate:Normal     Neuro/Psych negative neurological ROS  negative psych ROS   GI/Hepatic GERD  Medicated,  Endo/Other  diabetes, Well Controlled, Type 2, Oral Hypoglycemic Agents, Insulin Dependentobesity  Renal/GU   negative genitourinary   Musculoskeletal negative musculoskeletal ROS (+)   Abdominal   Peds negative pediatric ROS (+)  Hematology negative hematology ROS (+)   Anesthesia Other Findings   Reproductive/Obstetrics negative OB ROS                            Anesthesia Physical  Anesthesia Plan  ASA: 3  Anesthesia Plan: General   Post-op Pain Management: Minimal or no pain anticipated, Tylenol PO (pre-op)*, Celebrex PO (pre-op)* and Regional block*   Induction:   PONV Risk Score and Plan: 2 and Dexamethasone, Ondansetron and Treatment may vary due to age or medical condition  Airway Management Planned: Oral ETT and LMA  Additional Equipment: None  Intra-op Plan:   Post-operative Plan: Extubation in OR  Informed Consent: I have reviewed the patients History and Physical, chart, labs and discussed the procedure including the risks, benefits and alternatives for the proposed anesthesia with the patient or authorized representative who has indicated his/her understanding and acceptance.     Dental advisory given  Plan  Discussed with: CRNA and Anesthesiologist  Anesthesia Plan Comments: (Discussed both nerve block for pain relief post-op and GA; including NV, sore throat, dental injury, and pulmonary complications)       Anesthesia Quick Evaluation

## 2022-08-24 NOTE — Progress Notes (Signed)
Assisted Dr. Oddono with right, adductor canal, ultrasound guided block. Side rails up, monitors on throughout procedure. See vital signs in flow sheet. Tolerated Procedure well. 

## 2022-08-25 ENCOUNTER — Encounter: Payer: Self-pay | Admitting: Orthopaedic Surgery

## 2022-08-25 ENCOUNTER — Encounter (HOSPITAL_BASED_OUTPATIENT_CLINIC_OR_DEPARTMENT_OTHER): Payer: Self-pay | Admitting: Orthopaedic Surgery

## 2022-08-25 NOTE — Telephone Encounter (Signed)
Spoke to patient and he will come in Friday morning to get bandages removed and bledsoe adjusted.

## 2022-08-26 ENCOUNTER — Ambulatory Visit: Payer: BC Managed Care – PPO

## 2022-08-30 ENCOUNTER — Telehealth: Payer: Self-pay | Admitting: Orthopaedic Surgery

## 2022-08-30 NOTE — Telephone Encounter (Signed)
Pt's wife submitted medical release form, FMLA forms, and $25.00 check payment to [employer]. Accepted 08/30/2022

## 2022-09-01 ENCOUNTER — Telehealth: Payer: Self-pay | Admitting: Orthopaedic Surgery

## 2022-09-01 NOTE — Telephone Encounter (Signed)
Noted for [employer] ?

## 2022-09-01 NOTE — Telephone Encounter (Signed)
Forms received for disability, please advise how long patient will be oow and provide note (Had surgery 08/24/22 "repair right quadriceps rupture through bone tunnels")

## 2022-09-01 NOTE — Telephone Encounter (Signed)
Anticipate 3 months

## 2022-09-01 NOTE — Telephone Encounter (Signed)
I have added work note to patient's chart. Thanks!

## 2022-09-07 ENCOUNTER — Ambulatory Visit (INDEPENDENT_AMBULATORY_CARE_PROVIDER_SITE_OTHER): Payer: BC Managed Care – PPO | Admitting: Orthopaedic Surgery

## 2022-09-07 ENCOUNTER — Encounter: Payer: Self-pay | Admitting: Orthopaedic Surgery

## 2022-09-07 DIAGNOSIS — S76111D Strain of right quadriceps muscle, fascia and tendon, subsequent encounter: Secondary | ICD-10-CM

## 2022-09-07 NOTE — Progress Notes (Signed)
   Post-Op Visit Note   Patient: Jon Torres           Date of Birth: 06/17/1966           MRN: [id number] Visit Date: 09/07/2022 PCP: Medicine, [facility]   Assessment & Plan:  Chief Complaint:  Chief Complaint  Patient presents with   Right Leg - Follow-up    Quad repair 08/24/2022   Visit Diagnoses:  1. Rupture of right quadriceps muscle, subsequent encounter     Plan: Patient is a pleasant 56 year old gentleman comes in today 2 weeks status post right quad repair, date of surgery 08/24/2022.  He has been doing well.  He is taking over-the-counter medication for pain.  He has been compliant wearing his Bledsoe brace locked from 0 to 30 degrees.  Examination of the right knee reveals a well-healed surgical incision.  No evidence of infection or cellulitis.  She does have moderate swelling to the knee.  Calf is soft and nontender.  He is neurovascular intact distally.  Today, Steri-Strips were applied.  He will continue wearing his Bledsoe brace locked from 0 to 30 degrees.  He will follow-up with [place] in 2 weeks for repeat evaluation.  At that point, we will likely unlock his brace to 60 degrees and initiate formal physical therapy without flexion past 90 degrees.  He would like to go to physical therapy at [facility] on [address] in [place].  We plan to assess if he is ready to drive at that point as well.  He will call with concerns or questions in the meantime.  Follow-Up Instructions: Return in about 2 weeks (around 09/21/2022).   Orders:  No orders of the defined types were placed in this encounter.  No orders of the defined types were placed in this encounter.   Imaging: No new imaging  PMFS History: Patient Active Problem List   Diagnosis Date Noted   Rupture of right quadriceps muscle    Past Medical History:  Diagnosis Date   Diabetes mellitus without complication (HCC)    GERD (gastroesophageal reflux disease)    HLD (hyperlipidemia)      No family history on file.  Past Surgical History:  Procedure Laterality Date   NASAL SEPTUM SURGERY     NASAL TURBINATE REDUCTION Bilateral 09/24/2020   Procedure: TURBINATE REDUCTION/SUBMUCOSAL RESECTION;  Surgeon: Christia Reading, MD;  Location: [facility];  Service: ENT;  Laterality: Bilateral;   QUADRICEPS TENDON REPAIR Right 08/24/2022   Procedure: RIGHT QUADRICEPS REPAIR;  Surgeon: Tarry Kos, MD;  Location: [facility];  Service: Orthopedics;  Laterality: Right;   TONSILLECTOMY     VASECTOMY     vericocele     as a child   Social History   Occupational History   Not on file  Tobacco Use   Smoking status: Never   Smokeless tobacco: Never  Vaping Use   Vaping Use: Never used  Substance and Sexual Activity   Alcohol use: Yes    Comment: 1 week   Drug use: Never   Sexual activity: Not on file

## 2022-09-21 ENCOUNTER — Telehealth: Payer: Self-pay | Admitting: Orthopaedic Surgery

## 2022-09-21 NOTE — Telephone Encounter (Signed)
[employer] forms received. To [employer]. 

## 2022-09-22 ENCOUNTER — Ambulatory Visit (INDEPENDENT_AMBULATORY_CARE_PROVIDER_SITE_OTHER): Payer: BC Managed Care – PPO | Admitting: Orthopaedic Surgery

## 2022-09-22 DIAGNOSIS — S76111D Strain of right quadriceps muscle, fascia and tendon, subsequent encounter: Secondary | ICD-10-CM

## 2022-09-22 NOTE — Progress Notes (Signed)
   Post-Op Visit Note   Patient: Jon Torres           Date of Birth: 1966-07-03           MRN: [id number] Visit Date: 09/22/2022 PCP: Medicine, [facility]   Assessment & Plan:  Chief Complaint:  Chief Complaint  Patient presents with   Right Knee - Routine Post Op   Visit Diagnoses:  1. Rupture of right quadriceps muscle, subsequent encounter     Plan: Jon Torres returns today 4 weeks status post right quadriceps repair on 08/24/2022.  He has no real complaints.  Examination of the right knee shows fully healed surgical scar.  Mild residual swelling.  He can do a straight leg lift against gravity.  He can passively flex his knee comfortably to about 70 degrees.  Today I opened his brace up to 70 degrees of flexion.  I will send him to outpatient PT at [facility] to begin gait training, range of motion.  He can begin strengthening in 2 weeks.  Recheck in 2 weeks for brace adjustment.  He is still not ready to return back to work so we will need to hold him out of work for another 8 weeks.  Follow-Up Instructions: Return in about 2 weeks (around 10/06/2022).   Orders:  No orders of the defined types were placed in this encounter.  No orders of the defined types were placed in this encounter.   Imaging: No results found.  PMFS History: Patient Active Problem List   Diagnosis Date Noted   Rupture of right quadriceps muscle    Past Medical History:  Diagnosis Date   Diabetes mellitus without complication (HCC)    GERD (gastroesophageal reflux disease)    HLD (hyperlipidemia)     No family history on file.  Past Surgical History:  Procedure Laterality Date   NASAL SEPTUM SURGERY     NASAL TURBINATE REDUCTION Bilateral 09/24/2020   Procedure: TURBINATE REDUCTION/SUBMUCOSAL RESECTION;  Surgeon: Christia Reading, MD;  Location: [facility];  Service: ENT;  Laterality: Bilateral;   QUADRICEPS TENDON REPAIR Right 08/24/2022   Procedure: RIGHT  QUADRICEPS REPAIR;  Surgeon: Tarry Kos, MD;  Location: [facility];  Service: Orthopedics;  Laterality: Right;   TONSILLECTOMY     VASECTOMY     vericocele     as a child   Social History   Occupational History   Not on file  Tobacco Use   Smoking status: Never   Smokeless tobacco: Never  Vaping Use   Vaping Use: Never used  Substance and Sexual Activity   Alcohol use: Yes    Comment: 1 week   Drug use: Never   Sexual activity: Not on file

## 2022-10-07 ENCOUNTER — Ambulatory Visit (INDEPENDENT_AMBULATORY_CARE_PROVIDER_SITE_OTHER): Payer: BC Managed Care – PPO | Admitting: Orthopaedic Surgery

## 2022-10-07 DIAGNOSIS — S76111D Strain of right quadriceps muscle, fascia and tendon, subsequent encounter: Secondary | ICD-10-CM

## 2022-10-07 MED ORDER — METHOCARBAMOL 750 MG PO TABS: 750.0000 mg | ORAL_TABLET | Freq: Two times a day (BID) | ORAL | 2 refills | Status: AC | PRN

## 2022-10-07 MED ORDER — METHYLPREDNISOLONE 4 MG PO TBPK: ORAL_TABLET | ORAL | 0 refills | Status: AC

## 2022-10-07 NOTE — Progress Notes (Signed)
   Post-Op Visit Note   Patient: Jon Torres           Date of Birth: 19-Dec-1965           MRN: [id number] Visit Date: 10/07/2022 PCP: Medicine, [facility]   Assessment & Plan:  Chief Complaint:  Chief Complaint  Patient presents with   Right Leg - Routine Post Op   Visit Diagnoses:  1. Rupture of right quadriceps muscle, subsequent encounter     Plan: Patient is a pleasant 56 year old gentleman who comes in today 6 weeks status post right quadriceps repair 08/24/2022.  He has been doing relatively well.  He has been in physical therapy making good progress.  He has been compliant wearing his Bledsoe brace which the therapist opened up to 100 degrees of flexion recently.  Examination of the right knee reveals range of motion from about 5 to 90 degrees.  He is neurovascular intact distally.  At this point, he will continue wearing his hinged knee brace from 0 to 100 degrees of flexion.  Continue with physical therapy.  Out of work note provided for another 5 weeks.  Follow-up in 4 weeks for recheck.  Call with concerns or questions in meantime.  Follow-Up Instructions: Return in about 4 weeks (around 11/04/2022).   Orders:  No orders of the defined types were placed in this encounter.  No orders of the defined types were placed in this encounter.   Imaging: No new imaging  PMFS History: Patient Active Problem List   Diagnosis Date Noted   Rupture of right quadriceps muscle    Past Medical History:  Diagnosis Date   Diabetes mellitus without complication (HCC)    GERD (gastroesophageal reflux disease)    HLD (hyperlipidemia)     No family history on file.  Past Surgical History:  Procedure Laterality Date   NASAL SEPTUM SURGERY     NASAL TURBINATE REDUCTION Bilateral 09/24/2020   Procedure: TURBINATE REDUCTION/SUBMUCOSAL RESECTION;  Surgeon: Christia Reading, MD;  Location: [facility];  Service: ENT;  Laterality: Bilateral;   QUADRICEPS  TENDON REPAIR Right 08/24/2022   Procedure: RIGHT QUADRICEPS REPAIR;  Surgeon: Tarry Kos, MD;  Location: [facility];  Service: Orthopedics;  Laterality: Right;   TONSILLECTOMY     VASECTOMY     vericocele     as a child   Social History   Occupational History   Not on file  Tobacco Use   Smoking status: Never   Smokeless tobacco: Never  Vaping Use   Vaping Use: Never used  Substance and Sexual Activity   Alcohol use: Yes    Comment: 1 week   Drug use: Never   Sexual activity: Not on file

## 2022-11-01 ENCOUNTER — Telehealth: Payer: Self-pay | Admitting: Orthopaedic Surgery

## 2022-11-01 NOTE — Telephone Encounter (Signed)
[employer] forms received. To [employer]. 

## 2022-11-03 ENCOUNTER — Ambulatory Visit (INDEPENDENT_AMBULATORY_CARE_PROVIDER_SITE_OTHER): Payer: BC Managed Care – PPO | Admitting: Physician Assistant

## 2022-11-03 DIAGNOSIS — S76111D Strain of right quadriceps muscle, fascia and tendon, subsequent encounter: Secondary | ICD-10-CM

## 2022-11-03 NOTE — Progress Notes (Signed)
   Post-Op Visit Note   Patient: Jon Torres           Date of Birth: 26-Aug-1966           MRN: [id number] Visit Date: 11/03/2022 PCP: Medicine, [facility]   Assessment & Plan:  Chief Complaint:  Chief Complaint  Patient presents with   Right Knee - Routine Post Op   Visit Diagnoses:  1. Rupture of right quadriceps muscle, subsequent encounter     Plan: Patient is a pleasant 57 year old gentleman who comes in today approximately 10 weeks status post right quad repair 08/24/2022.  He has been doing well.  Minimal pain.  He has been in physical therapy where he was weaned out of his brace 2 weeks ago.  He is working on primarily quadriceps strength.  Examination of his right knee reveals active range of motion from about 5 degrees of extension to 120 degrees of flexion.  I can passively get him to neutral.  Resisted straight leg raise shows 3 out of 5 strength.  He is neurovascular intact distally.  At this point, he will continue to work on strengthening exercises at therapy and at home.  He will follow-up with [place] in 6 weeks for recheck.  New work note provided to be out for another 6 weeks.  Call with concerns or questions.  Follow-Up Instructions: Return in about 6 weeks (around 12/15/2022).   Orders:  No orders of the defined types were placed in this encounter.  No orders of the defined types were placed in this encounter.   Imaging: No new imaging  PMFS History: Patient Active Problem List   Diagnosis Date Noted   Rupture of right quadriceps muscle    Past Medical History:  Diagnosis Date   Diabetes mellitus without complication (HCC)    GERD (gastroesophageal reflux disease)    HLD (hyperlipidemia)     No family history on file.  Past Surgical History:  Procedure Laterality Date   NASAL SEPTUM SURGERY     NASAL TURBINATE REDUCTION Bilateral 09/24/2020   Procedure: TURBINATE REDUCTION/SUBMUCOSAL RESECTION;  Surgeon: Melida Quitter, MD;  Location:  [place];  Service: ENT;  Laterality: Bilateral;   QUADRICEPS TENDON REPAIR Right 08/24/2022   Procedure: RIGHT QUADRICEPS REPAIR;  Surgeon: Leandrew Koyanagi, MD;  Location: [place];  Service: Orthopedics;  Laterality: Right;   TONSILLECTOMY     VASECTOMY     vericocele     as a child   Social History   Occupational History   Not on file  Tobacco Use   Smoking status: Never   Smokeless tobacco: Never  Vaping Use   Vaping Use: Never used  Substance and Sexual Activity   Alcohol use: Yes    Comment: 1 week   Drug use: Never   Sexual activity: Not on file

## 2022-12-13 ENCOUNTER — Encounter: Payer: Self-pay | Admitting: Orthopaedic Surgery

## 2022-12-13 ENCOUNTER — Ambulatory Visit (INDEPENDENT_AMBULATORY_CARE_PROVIDER_SITE_OTHER): Payer: BC Managed Care – PPO | Admitting: Orthopaedic Surgery

## 2022-12-13 DIAGNOSIS — S76111D Strain of right quadriceps muscle, fascia and tendon, subsequent encounter: Secondary | ICD-10-CM

## 2022-12-13 NOTE — Progress Notes (Signed)
   Post-Op Visit Note   Patient: Jon Torres           Date of Birth: 1965/11/09           MRN: [id number] Visit Date: 12/13/2022 PCP: Medicine, [facility]   Assessment & Plan:  Chief Complaint:  Chief Complaint  Patient presents with   Right Knee - Follow-up    Right quadriceps repair 08/24/2022   Visit Diagnoses:  1. Rupture of right quadriceps muscle, subsequent encounter     Plan: Patient is a pleasant 57 year old gentleman who comes in today almost 4 months status post right quadriceps repair 08/24/2022.  He has been doing okay but feels as though he has plateaued with range of motion and strengthening.  He has finished physical therapy.  His main issue is ascending stairs and feels as though he does not have his enough strength.  He denies any pain.  Examination of his right lower extremity reveals a fully healed surgical scar without complication.  Range of motion 0 to 120 degrees.  3 out of 5 strength with resisted straight leg raise.  He is neurovascular intact distally.  At this point, I think he needs to continue working on quad strengthening.  I discussed that this will take some time.  He will follow-up with [place] as needed. Follow-Up Instructions: Return if symptoms worsen or fail to improve.   Orders:  No orders of the defined types were placed in this encounter.  No orders of the defined types were placed in this encounter.   Imaging: No new imaging  PMFS History: Patient Active Problem List   Diagnosis Date Noted   Rupture of right quadriceps muscle    Past Medical History:  Diagnosis Date   Diabetes mellitus without complication (HCC)    GERD (gastroesophageal reflux disease)    HLD (hyperlipidemia)     No family history on file.  Past Surgical History:  Procedure Laterality Date   NASAL SEPTUM SURGERY     NASAL TURBINATE REDUCTION Bilateral 09/24/2020   Procedure: TURBINATE REDUCTION/SUBMUCOSAL RESECTION;  Surgeon: Melida Quitter, MD;   Location: [place];  Service: ENT;  Laterality: Bilateral;   QUADRICEPS TENDON REPAIR Right 08/24/2022   Procedure: RIGHT QUADRICEPS REPAIR;  Surgeon: Leandrew Koyanagi, MD;  Location: [place];  Service: Orthopedics;  Laterality: Right;   TONSILLECTOMY     VASECTOMY     vericocele     as a child   Social History   Occupational History   Not on file  Tobacco Use   Smoking status: Never   Smokeless tobacco: Never  Vaping Use   Vaping Use: Never used  Substance and Sexual Activity   Alcohol use: Yes    Comment: 1 week   Drug use: Never   Sexual activity: Not on file

## 2022-12-26 ENCOUNTER — Encounter: Payer: Self-pay | Admitting: Orthopaedic Surgery

## 2022-12-26 NOTE — Telephone Encounter (Signed)
For 6 months.

## 2022-12-27 NOTE — Telephone Encounter (Signed)
Please update note per his request.  Thx

## 2022-12-30 ENCOUNTER — Telehealth: Payer: Self-pay | Admitting: Orthopaedic Surgery

## 2022-12-30 NOTE — Telephone Encounter (Signed)
[employer] forms received. To [employer].
